# Patient Record
Sex: Female | Born: 1965
Health system: Southern US, Community
[De-identification: ages and names within clinical notes are randomized; demographics above are authoritative.]

## PROBLEM LIST (undated history)

## (undated) ENCOUNTER — Encounter: Attending: Hematology & Oncology | Primary: Hematology & Oncology

## (undated) ENCOUNTER — Encounter: Attending: Oncology | Primary: Oncology

## (undated) ENCOUNTER — Encounter

## (undated) ENCOUNTER — Ambulatory Visit

## (undated) ENCOUNTER — Ambulatory Visit: Attending: Radiation Oncology | Primary: Radiation Oncology

## (undated) ENCOUNTER — Telehealth

## (undated) ENCOUNTER — Ambulatory Visit: Payer: PRIVATE HEALTH INSURANCE | Attending: Hematology & Oncology | Primary: Hematology & Oncology

## (undated) ENCOUNTER — Telehealth: Attending: Hematology & Oncology | Primary: Hematology & Oncology

## (undated) ENCOUNTER — Non-Acute Institutional Stay: Payer: PRIVATE HEALTH INSURANCE

## (undated) ENCOUNTER — Encounter: Attending: Radiation Oncology | Primary: Radiation Oncology

## (undated) ENCOUNTER — Ambulatory Visit: Attending: Hematology & Oncology | Primary: Hematology & Oncology

## (undated) ENCOUNTER — Encounter: Payer: PRIVATE HEALTH INSURANCE | Attending: Hematology & Oncology | Primary: Hematology & Oncology

## (undated) ENCOUNTER — Telehealth: Attending: Oncology | Primary: Oncology

## (undated) ENCOUNTER — Encounter: Payer: PRIVATE HEALTH INSURANCE | Attending: Radiation Oncology | Primary: Radiation Oncology

## (undated) ENCOUNTER — Encounter: Payer: PRIVATE HEALTH INSURANCE | Attending: Adult Health | Primary: Adult Health

## (undated) ENCOUNTER — Telehealth
Attending: Student in an Organized Health Care Education/Training Program | Primary: Student in an Organized Health Care Education/Training Program

## (undated) ENCOUNTER — Ambulatory Visit: Payer: PRIVATE HEALTH INSURANCE

## (undated) ENCOUNTER — Telehealth: Attending: Surgery | Primary: Surgery

## (undated) ENCOUNTER — Telehealth: Attending: Radiation Oncology | Primary: Radiation Oncology

## (undated) ENCOUNTER — Encounter
Attending: Student in an Organized Health Care Education/Training Program | Primary: Student in an Organized Health Care Education/Training Program

## (undated) ENCOUNTER — Encounter: Payer: PRIVATE HEALTH INSURANCE | Attending: Surgery | Primary: Surgery

## (undated) ENCOUNTER — Encounter: Payer: PRIVATE HEALTH INSURANCE | Attending: Nurse Practitioner | Primary: Nurse Practitioner

## (undated) DIAGNOSIS — C211 Malignant neoplasm of anal canal: Secondary | ICD-10-CM

## (undated) DIAGNOSIS — F32A Depression, unspecified: Secondary | ICD-10-CM

## (undated) DIAGNOSIS — F329 Major depressive disorder, single episode, unspecified: Secondary | ICD-10-CM

## (undated) HISTORY — PX: DILATION AND CURETTAGE OF UTERUS: SHX78

## (undated) MED ORDER — ENCORAFENIB 75 MG CAPSULE: Freq: Every day | ORAL | 0 days

## (undated) MED ORDER — APIXABAN 5 MG TABLET: Freq: Two times a day (BID) | ORAL | 0.00000 days

## (undated) MED ORDER — BINIMETINIB 15 MG TABLET: Freq: Two times a day (BID) | ORAL | 0 days

## (undated) MED ORDER — CLOTRIMAZOLE-BETAMETHASONE 1 %-0.05 % LOTION: Freq: Two times a day (BID) | TOPICAL | 0.00000 days

## (undated) MED ORDER — AMLODIPINE 10 MG TABLET: Freq: Every day | ORAL | 0 days

---

## 2008-04-04 ENCOUNTER — Encounter: Admission: RE | Admit: 2008-04-04 | Discharge: 2008-04-04 | Payer: Self-pay | Admitting: Unknown Physician Specialty

## 2008-06-19 ENCOUNTER — Encounter: Admission: RE | Admit: 2008-06-19 | Discharge: 2008-06-19 | Payer: Self-pay | Admitting: Unknown Physician Specialty

## 2009-05-27 ENCOUNTER — Encounter: Admission: RE | Admit: 2009-05-27 | Discharge: 2009-05-27 | Payer: Self-pay | Admitting: Family Medicine

## 2010-01-11 ENCOUNTER — Encounter: Admission: RE | Admit: 2010-01-11 | Discharge: 2010-01-11 | Payer: Self-pay | Admitting: Unknown Physician Specialty

## 2010-05-05 ENCOUNTER — Encounter: Admission: RE | Admit: 2010-05-05 | Discharge: 2010-05-05 | Payer: Self-pay | Admitting: Unknown Physician Specialty

## 2013-05-06 ENCOUNTER — Ambulatory Visit (INDEPENDENT_AMBULATORY_CARE_PROVIDER_SITE_OTHER): Payer: Self-pay

## 2013-05-06 ENCOUNTER — Other Ambulatory Visit: Payer: Self-pay | Admitting: Adult Health

## 2013-05-06 DIAGNOSIS — R52 Pain, unspecified: Secondary | ICD-10-CM

## 2013-05-06 DIAGNOSIS — M25519 Pain in unspecified shoulder: Secondary | ICD-10-CM

## 2013-08-18 ENCOUNTER — Encounter: Payer: Self-pay | Admitting: Emergency Medicine

## 2013-08-18 ENCOUNTER — Emergency Department
Admission: EM | Admit: 2013-08-18 | Discharge: 2013-08-18 | Disposition: A | Discharge status: Home / Self Care | Payer: Federal, State, Local not specified - PPO | Source: Home / Self Care | Attending: Emergency Medicine | Admitting: Emergency Medicine

## 2013-08-18 DIAGNOSIS — F32A Depression, unspecified: Secondary | ICD-10-CM | POA: Insufficient documentation

## 2013-08-18 DIAGNOSIS — F329 Major depressive disorder, single episode, unspecified: Secondary | ICD-10-CM | POA: Insufficient documentation

## 2013-08-18 DIAGNOSIS — J029 Acute pharyngitis, unspecified: Secondary | ICD-10-CM

## 2013-08-18 DIAGNOSIS — H6691 Otitis media, unspecified, right ear: Secondary | ICD-10-CM

## 2013-08-18 DIAGNOSIS — H6690 Otitis media, unspecified, unspecified ear: Secondary | ICD-10-CM

## 2013-08-18 HISTORY — DX: Depression, unspecified: F32.A

## 2013-08-18 HISTORY — DX: Major depressive disorder, single episode, unspecified: F32.9

## 2013-08-18 LAB — POCT RAPID STREP A (OFFICE): RAPID STREP A SCREEN: NEGATIVE

## 2013-08-18 MED ORDER — AMOXICILLIN 875 MG PO TABS: 875.0000 mg | ORAL_TABLET | Freq: Two times a day (BID) | ORAL | Status: DC

## 2013-08-18 NOTE — ED Provider Notes (Signed)
CSN: [id number]     Arrival date & time 08/18/13  1433 History   First MD Initiated Contact with Patient 08/18/13 1503     Chief Complaint  Patient presents with  . Sore Throat    x 3 days  . Otalgia    x 3 days   (Consider location/radiation/quality/duration/timing/severity/associated sxs/prior Treatment) HPI Danielle Gonzales is a 48 y.o. female who complains of onset of cold symptoms for 5 days.  The symptoms are constant and mild-moderate in severity.  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 + sore throat No cough No pleuritic pain No wheezing No nasal congestion No post-nasal drainage No sinus pain/pressure No chest congestion No itchy/red eyes + R earache No hemoptysis No SOB No chills/sweats No fever No nausea No vomiting No abdominal pain No diarrhea No skin rashes No fatigue No myalgias No headache     Past Medical History  Diagnosis Date  . Depression    Past Surgical History  Procedure Laterality Date  . Dilation and curettage of uterus     History reviewed. No pertinent family history. History  Substance Use Topics  . Smoking status: Current Every Day Smoker -- 1.00 packs/day for 20 years    Types: Cigarettes  . Smokeless tobacco: Never Used  . Alcohol Use: No   OB History   Grav Para Term Preterm Abortions TAB SAB Ect Mult Living                 Review of Systems  All other systems reviewed and are negative.    Allergies  Review of patient's allergies indicates no known allergies.  Home Medications   Current Outpatient Rx  Name  Route  Sig  Dispense  Refill  . buPROPion (WELLBUTRIN XL) 300 MG 24 hr tablet   Oral   Take 300 mg by mouth daily.         . Multiple Vitamins-Minerals (MULTIVITAMIN WITH MINERALS) tablet   Oral   Take 1 tablet by mouth daily.         . propranolol (INDERAL) 60 MG tablet   Oral   Take 60 mg by mouth 3 (three) times daily.         Marland Kitchen amoxicillin (AMOXIL) 875 MG tablet  Oral   Take 1 tablet (875 mg total) by mouth 2 (two) times daily.   14 tablet   0    BP 131/90  Pulse 88  Temp(Src) 98.2 F (36.8 C) (Oral)  Ht 5\' 2"  (1.575 m)  Wt 172 lb (78.019 kg)  BMI 31.45 kg/m2  SpO2 97%  LMP 08/03/2013 Physical Exam  Nursing note and vitals reviewed. Constitutional: She is oriented to person, place, and time. She appears well-developed and well-nourished.  HENT:  Head: Normocephalic and atraumatic.  Right Ear: External ear and ear canal normal. A middle ear effusion is present.  Left Ear: Tympanic membrane, external ear and ear canal normal.  Nose: Mucosal edema present.  Mouth/Throat: Posterior oropharyngeal erythema (minimal, clear post nasal drip) present. No oropharyngeal exudate or posterior oropharyngeal edema.  Eyes: No scleral icterus.  Neck: Neck supple.  Cardiovascular: Regular rhythm and normal heart sounds.   Pulmonary/Chest: Effort normal and breath sounds normal. No respiratory distress. She has no decreased breath sounds. She has no wheezes. She has no rhonchi.  Neurological: She is alert and oriented to person, place, and time.  Skin: Skin is warm and dry.  Psychiatric: She has a normal mood  and affect. Her speech is normal.    ED Course  Procedures (including critical care time) Labs Review Labs Reviewed  POCT RAPID STREP A (OFFICE) - Normal   Imaging Review No results found.  Results for orders placed during the hospital encounter of 08/18/13  POCT RAPID STREP A (OFFICE)      Result Value Ref Range   Rapid Strep A Screen Negative  Negative   '   MDM   1. Sore throat   2. Right otitis media    1)  Take the prescribed antibiotic as instructed for Right acute otitis media.  Rapid strep negative.  No culture performed. 2)  Use nasal saline solution (over the counter) at least 3 times a day. 3)  Use over the counter decongestants like Zyrtec-D every 12 hours as needed to help with congestion.  If you have hypertension, do  not take medicines with sudafed.  4)  Can take tylenol every 6 hours or motrin every 8 hours for pain or fever. 5)  Follow up with your primary doctor if no improvement in 5-7 days, sooner if increasing pain, fever, or new symptoms.     Janeann Forehand, MD 08/18/13 [phone]

## 2013-08-18 NOTE — ED Notes (Signed)
Danielle Gonzales complains of sore throat and right ear pain for 3 days. Denies fever, chills, sweats, cough or shortness or breath.

## 2015-02-28 IMAGING — CR DG SHOULDER 2+V*L*
3 series · 3 of 3 positions shown · non-contrast
Comparison: None.

CLINICAL DATA: Lifting injury.  Left shoulder pain.

EXAM:
LEFT SHOULDER - 2+ VIEW

[view not recorded (1 of 3)]
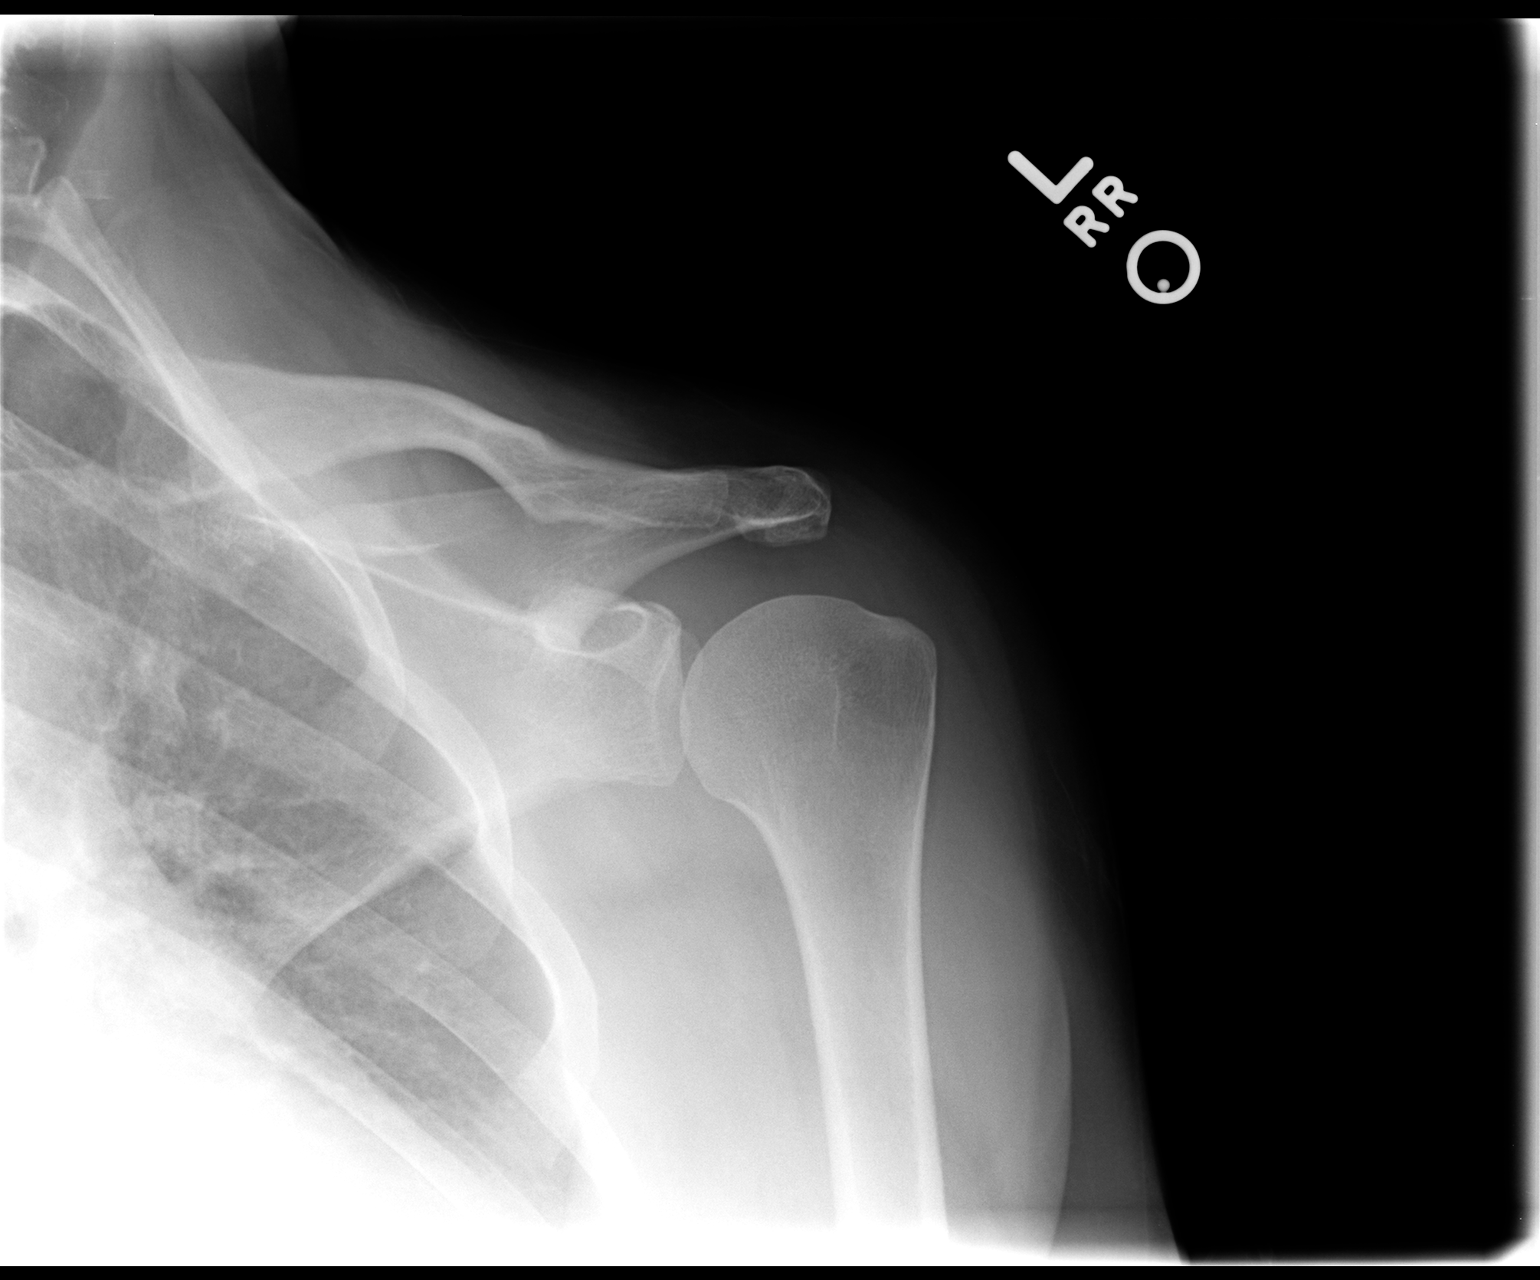

[view not recorded (2 of 3)]
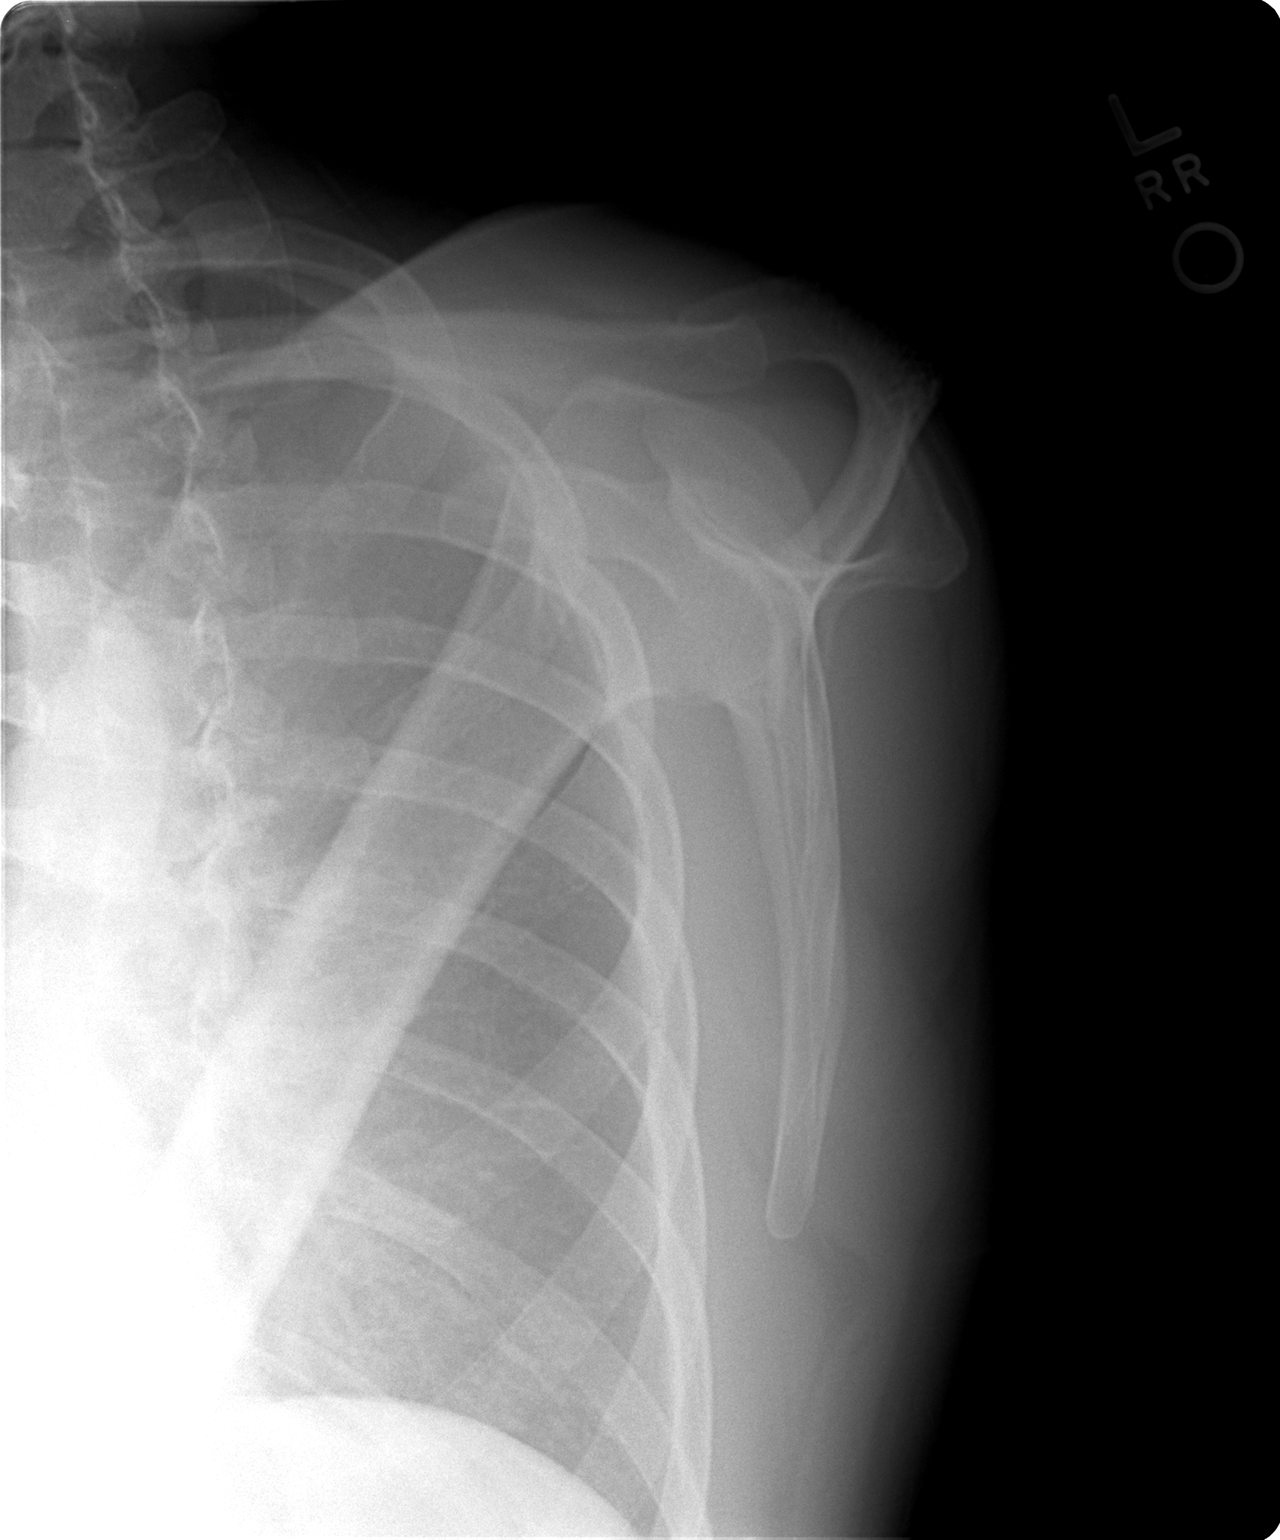

[view not recorded (3 of 3)]
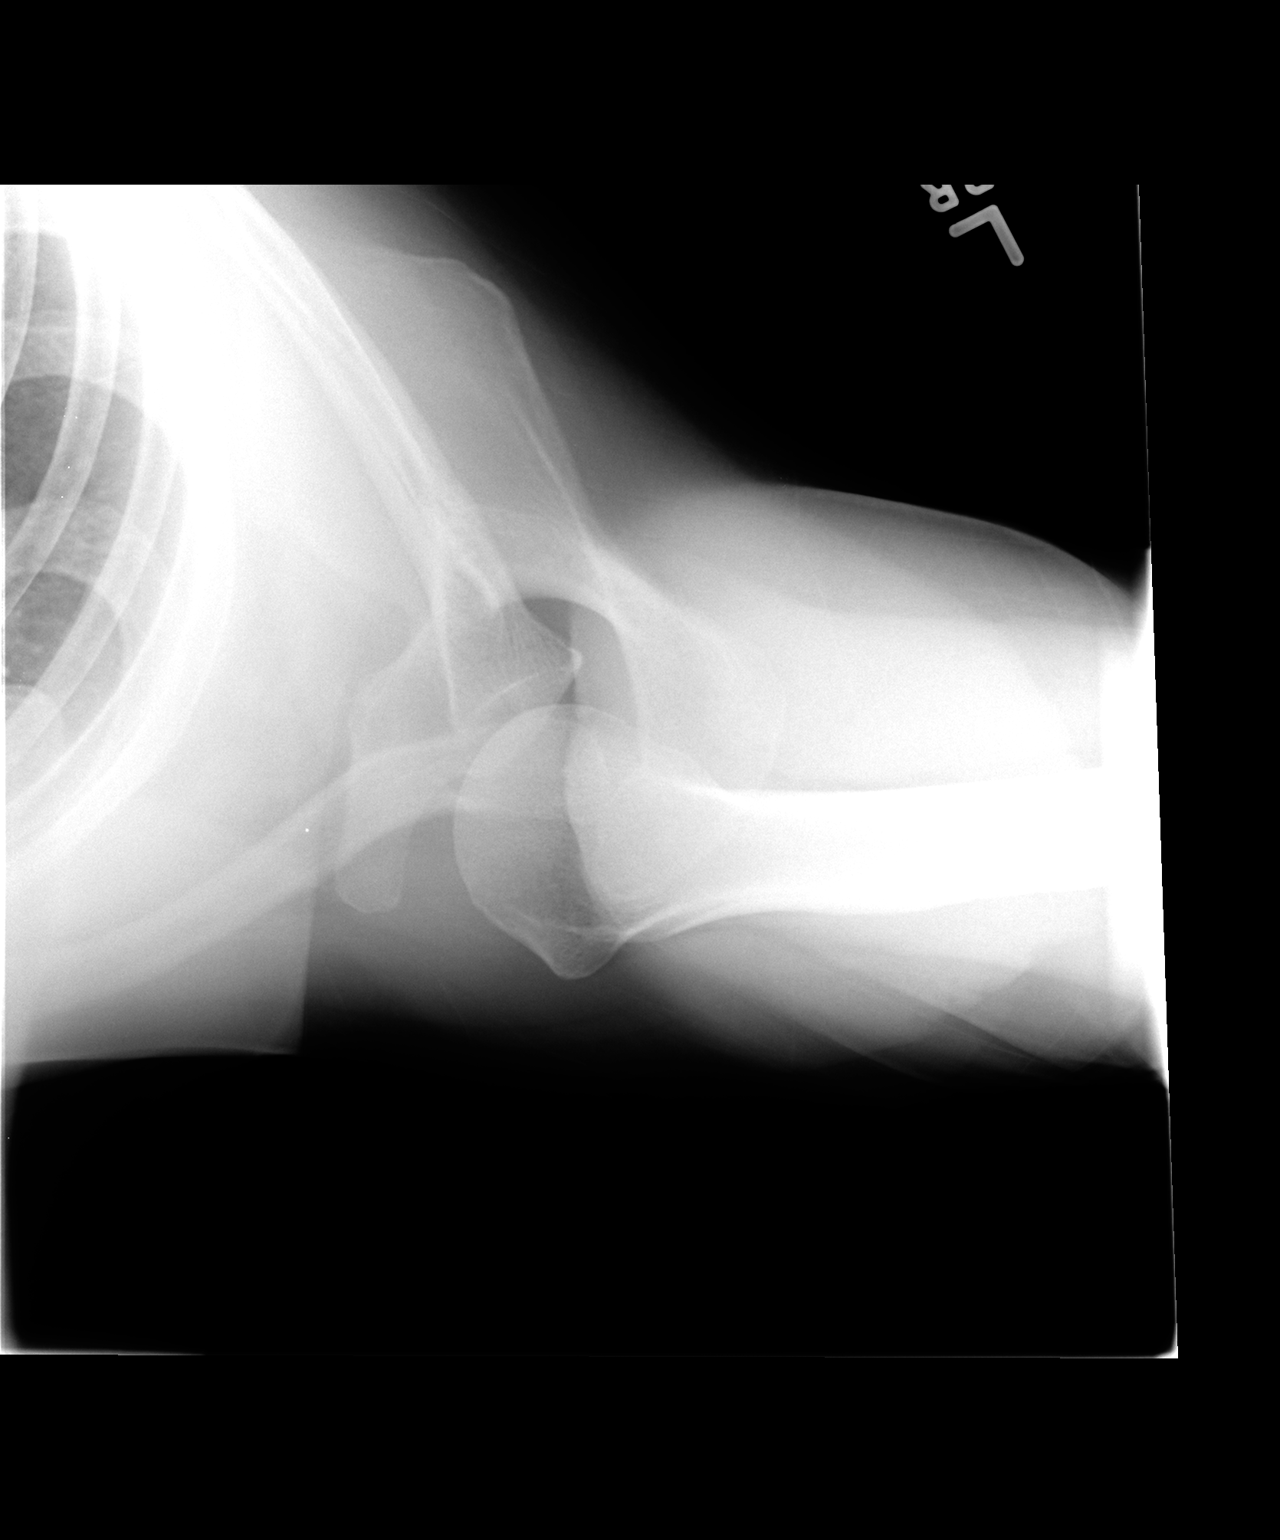

[3 of 3 positions shown; findings below may reference images not displayed]

FINDINGS: There is no evidence of fracture or dislocation. There is no
evidence of arthropathy or other focal bone abnormality. Soft
tissues are unremarkable.
IMPRESSION: Negative.

## 2015-09-10 ENCOUNTER — Encounter: Payer: Self-pay | Admitting: *Deleted

## 2015-09-10 ENCOUNTER — Emergency Department
Admission: EM | Admit: 2015-09-10 | Discharge: 2015-09-10 | Disposition: A | Discharge status: Home / Self Care | Payer: Federal, State, Local not specified - PPO | Source: Home / Self Care | Attending: Family Medicine | Admitting: Family Medicine

## 2015-09-10 DIAGNOSIS — R05 Cough: Secondary | ICD-10-CM

## 2015-09-10 DIAGNOSIS — L500 Allergic urticaria: Secondary | ICD-10-CM

## 2015-09-10 LAB — POCT CBC W AUTO DIFF (K'VILLE URGENT CARE)

## 2015-09-10 LAB — POCT RAPID STREP A (OFFICE): Rapid Strep A Screen: NEGATIVE

## 2015-09-10 MED ORDER — HYDROXYZINE HCL 25 MG PO TABS: ORAL_TABLET | ORAL | Status: DC

## 2015-09-10 MED ORDER — TRIAMCINOLONE ACETONIDE 40 MG/ML IJ SUSP
40.0000 mg | Freq: Once | INTRAMUSCULAR | Status: AC
  Administered 2015-09-10: 40 mg via INTRAMUSCULAR

## 2015-09-10 NOTE — Discharge Instructions (Signed)
Discontinue Benadryl and Zyrtec.  Continue Zantac.   Hives Hives are itchy, red, swollen areas of the skin. They can vary in size and location on your body. Hives can come and go for hours or several days (acute hives) or for several weeks (chronic hives). Hives do not spread from person to person (noncontagious). They may get worse with scratching, exercise, and emotional stress. CAUSES   Allergic reaction to food, additives, or drugs.  Infections, including the common cold.  Illness, such as vasculitis, lupus, or thyroid disease.  Exposure to sunlight, heat, or cold.  Exercise.  Stress.  Contact with chemicals. SYMPTOMS   Red or white swollen patches on the skin. The patches may change size, shape, and location quickly and repeatedly.  Itching.  Swelling of the hands, feet, and face. This may occur if hives develop deeper in the skin. DIAGNOSIS  Your caregiver can usually tell what is wrong by performing a physical exam. Skin or blood tests may also be done to determine the cause of your hives. In some cases, the cause cannot be determined. TREATMENT  Mild cases usually get better with medicines such as antihistamines. Severe cases may require an emergency epinephrine injection. If the cause of your hives is known, treatment includes avoiding that trigger.  HOME CARE INSTRUCTIONS   Avoid causes that trigger your hives.  Take antihistamines as directed by your caregiver to reduce the severity of your hives. Non-sedating or low-sedating antihistamines are usually recommended. Do not drive while taking an antihistamine.  Take any other medicines prescribed for itching as directed by your caregiver.  Wear loose-fitting clothing.  Keep all follow-up appointments as directed by your caregiver. SEEK MEDICAL CARE IF:   You have persistent or severe itching that is not relieved with medicine.  You have painful or swollen joints. SEEK IMMEDIATE MEDICAL CARE IF:   You have a  fever.  Your tongue or lips are swollen.  You have trouble breathing or swallowing.  You feel tightness in the throat or chest.  You have abdominal pain. These problems may be the first sign of a life-threatening allergic reaction. Call your local emergency services (911 in [place] MAKE SURE YOU:   Understand these instructions.  Will watch your condition.  Will get help right away if you are not doing well or get worse.   This information is not intended to replace advice given to you by your health care provider. Make sure you discuss any questions you have with your health care provider.   Document Released: 06/06/2005 Document Revised: 06/11/2013 Document Reviewed: 08/30/2011 Elsevier Interactive Patient Education [employer].

## 2015-09-10 NOTE — ED Notes (Signed)
Pt reports putting fertilizer on her lawn on 09/05/15. She developed an allergic reaction with hives and lip swelling on 09/07/15. She saw her PCP was given Zyrtec, Zantac, Benadryl, steroid injection and prednisone. She reports feeling better, until today her hives have returned. She also c/o runny nose, abd cramping, ears feel hot, lip swelling and joint pain. Reports labs drawn at PCP on 09/07/15 were normal.

## 2015-09-10 NOTE — ED Provider Notes (Signed)
CSN: [id number]     Arrival date & time 09/10/15  [id number] History   First MD Initiated Contact with Patient 09/10/15 1042     Chief Complaint  Patient presents with  . Urticaria      HPI Comments: Patient reports that she was spreading fertilizer 6 days ago.  The next day she became fatigued, followed the next day by development of hives and swelling around her lips and ears.  She visited her PCP where she received a steroid injection and started on Zyrtec, Zantac, Benadryl, and prednisone taper.  She felt better until today when her hives returned.  She also has developed nasal congestion, increased fatigue, myalgias, lower back ache, and vague lower abdominal discomfort ("like a menstrual cramp").  She notes that she has had occasional cough for about 5 days.  The history is provided by the patient.    Past Medical History  Diagnosis Date  . Depression    Past Surgical History  Procedure Laterality Date  . Dilation and curettage of uterus     History reviewed. No pertinent family history. Social History  Substance Use Topics  . Smoking status: Current Every Day Smoker -- 0.50 packs/day for 20 years    Types: Cigarettes  . Smokeless tobacco: Never Used  . Alcohol Use: No   OB History    No data available     Review of Systems No sore throat + cough No pleuritic pain No wheezing + nasal congestion + post-nasal drainage No sinus pain/pressure No itchy/red eyes No earache No hemoptysis No SOB No fever, but she feels hot  No nausea No vomiting + abdominal pain No diarrhea No urinary symptoms No skin rash + fatigue + myalgias + headache Used OTC meds without relief  Allergies  Review of patient's allergies indicates no known allergies.  Home Medications   Prior to Admission medications   Medication Sig Start Date End Date Taking? Authorizing Provider  cetirizine (ZYRTEC) 10 MG tablet Take 10 mg by mouth daily.   Yes Historical Provider, MD  diphenhydrAMINE  (BENADRYL) 25 MG tablet Take 25 mg by mouth every 6 (six) hours as needed.   Yes Historical Provider, MD  PREDNISONE PO Take by mouth.   Yes Historical Provider, MD  ranitidine (ZANTAC) 150 MG capsule Take 150 mg by mouth 2 (two) times daily.   Yes Historical Provider, MD  buPROPion (WELLBUTRIN XL) 300 MG 24 hr tablet Take 300 mg by mouth daily.    Historical Provider, MD  hydrOXYzine (ATARAX/VISTARIL) 25 MG tablet Take one or two tabs by mouth every 6 hours as needed for itching 09/10/15   Kandra Nicolas, MD  Multiple Vitamins-Minerals (MULTIVITAMIN WITH MINERALS) tablet Take 1 tablet by mouth daily.    Historical Provider, MD  propranolol (INDERAL) 60 MG tablet Take 60 mg by mouth 3 (three) times daily.    Historical Provider, MD   Meds Ordered and Administered this Visit   Medications  triamcinolone acetonide (KENALOG-40) injection 40 mg (not administered)    BP 148/91 mmHg  Pulse 96  Temp(Src) 98.1 F (36.7 C) (Oral)  Resp 16  Ht 5\' 3"  (1.6 m)  Wt 181 lb (82.101 kg)  BMI 32.07 kg/m2  SpO2 98% No data found.   Physical Exam Nursing notes and Vital Signs reviewed. Appearance:  Patient appears stated age, and in no acute distress Eyes:  Pupils are equal, round, and reactive to light and accomodation.  Extraocular movement is intact.  Conjunctivae are not inflamed  Ears:  Canals normal.  Tympanic membranes normal.  Nose:  Mildly congested turbinates.  No sinus tenderness.   Pharynx:  Normal Neck:  Supple.  Tender tonsillar nodes bilaterally.  Nontender enlarged posterior/lateral nodes are palpated bilaterally  Lungs:  Clear to auscultation.  Breath sounds are equal.  Moving air well. Heart:  Regular rate and rhythm without murmurs, rubs, or gallops.  Abdomen:  Nontender without masses or hepatosplenomegaly.  Bowel sounds are present.  No CVA or flank tenderness.  Extremities:  No edema.  Skin:  Minimal urticarial eruption   ED Course  Procedures none    Labs Reviewed  POCT  RAPID STREP A (OFFICE) negative  POCT CBC W AUTO DIFF ([facility]):  WBC 6.5; LY 34.3; MO 7.6; GR 58.1; Hgb 16.2; Platelets 282       MDM   1. Allergic urticaria ?etiology    Patient has early signs/symptoms of a developing viral URI Kenalog 40mg  IM Discontinue Benadryl and Zyrtec.  Continue Zantac. Begin Vistaril 25mg , one or two q6hr Followup with Family Doctor     Kandra Nicolas, MD 09/17/15 1346

## 2015-09-12 ENCOUNTER — Telehealth: Payer: Self-pay

## 2015-09-24 DIAGNOSIS — L501 Idiopathic urticaria: Secondary | ICD-10-CM | POA: Diagnosis not present

## 2015-09-24 DIAGNOSIS — T783XXA Angioneurotic edema, initial encounter: Secondary | ICD-10-CM | POA: Diagnosis not present

## 2015-10-21 DIAGNOSIS — K080 Exfoliation of teeth due to systemic causes: Secondary | ICD-10-CM | POA: Diagnosis not present

## 2015-11-06 DIAGNOSIS — G43909 Migraine, unspecified, not intractable, without status migrainosus: Secondary | ICD-10-CM | POA: Diagnosis not present

## 2015-11-06 DIAGNOSIS — J309 Allergic rhinitis, unspecified: Secondary | ICD-10-CM | POA: Diagnosis not present

## 2015-11-06 DIAGNOSIS — M5030 Other cervical disc degeneration, unspecified cervical region: Secondary | ICD-10-CM | POA: Diagnosis not present

## 2015-11-06 DIAGNOSIS — E7800 Pure hypercholesterolemia, unspecified: Secondary | ICD-10-CM | POA: Diagnosis not present

## 2016-02-11 DIAGNOSIS — S0502XA Injury of conjunctiva and corneal abrasion without foreign body, left eye, initial encounter: Secondary | ICD-10-CM | POA: Diagnosis not present

## 2016-02-11 DIAGNOSIS — H11432 Conjunctival hyperemia, left eye: Secondary | ICD-10-CM | POA: Diagnosis not present

## 2016-02-18 DIAGNOSIS — S0502XA Injury of conjunctiva and corneal abrasion without foreign body, left eye, initial encounter: Secondary | ICD-10-CM | POA: Diagnosis not present

## 2016-05-03 DIAGNOSIS — R739 Hyperglycemia, unspecified: Secondary | ICD-10-CM | POA: Diagnosis not present

## 2016-05-03 DIAGNOSIS — M9901 Segmental and somatic dysfunction of cervical region: Secondary | ICD-10-CM | POA: Diagnosis not present

## 2016-05-03 DIAGNOSIS — E7800 Pure hypercholesterolemia, unspecified: Secondary | ICD-10-CM | POA: Diagnosis not present

## 2016-05-03 DIAGNOSIS — G43909 Migraine, unspecified, not intractable, without status migrainosus: Secondary | ICD-10-CM | POA: Diagnosis not present

## 2016-05-03 DIAGNOSIS — M9902 Segmental and somatic dysfunction of thoracic region: Secondary | ICD-10-CM | POA: Diagnosis not present

## 2016-05-03 DIAGNOSIS — M50322 Other cervical disc degeneration at C5-C6 level: Secondary | ICD-10-CM | POA: Diagnosis not present

## 2016-05-03 DIAGNOSIS — Z0000 Encounter for general adult medical examination without abnormal findings: Secondary | ICD-10-CM | POA: Diagnosis not present

## 2016-05-03 DIAGNOSIS — G5603 Carpal tunnel syndrome, bilateral upper limbs: Secondary | ICD-10-CM | POA: Diagnosis not present

## 2016-05-03 DIAGNOSIS — M5412 Radiculopathy, cervical region: Secondary | ICD-10-CM | POA: Diagnosis not present

## 2016-05-03 DIAGNOSIS — R828 Abnormal findings on cytological and histological examination of urine: Secondary | ICD-10-CM | POA: Diagnosis not present

## 2016-05-03 DIAGNOSIS — Z1231 Encounter for screening mammogram for malignant neoplasm of breast: Secondary | ICD-10-CM | POA: Diagnosis not present

## 2016-06-03 DIAGNOSIS — Z1231 Encounter for screening mammogram for malignant neoplasm of breast: Secondary | ICD-10-CM | POA: Diagnosis not present

## 2016-06-23 DIAGNOSIS — R52 Pain, unspecified: Secondary | ICD-10-CM | POA: Diagnosis not present

## 2016-06-23 DIAGNOSIS — J069 Acute upper respiratory infection, unspecified: Secondary | ICD-10-CM | POA: Diagnosis not present

## 2016-06-23 DIAGNOSIS — R591 Generalized enlarged lymph nodes: Secondary | ICD-10-CM | POA: Diagnosis not present

## 2016-06-23 DIAGNOSIS — K645 Perianal venous thrombosis: Secondary | ICD-10-CM | POA: Diagnosis not present

## 2016-08-02 ENCOUNTER — Emergency Department
Admission: EM | Admit: 2016-08-02 | Discharge: 2016-08-02 | Disposition: A | Discharge status: Home / Self Care | Payer: Federal, State, Local not specified - PPO | Source: Home / Self Care | Attending: Family Medicine | Admitting: Family Medicine

## 2016-08-02 ENCOUNTER — Encounter: Payer: Self-pay | Admitting: *Deleted

## 2016-08-02 DIAGNOSIS — G510 Bell's palsy: Secondary | ICD-10-CM

## 2016-08-02 MED ORDER — VALACYCLOVIR HCL 1 G PO TABS: 1000.0000 mg | ORAL_TABLET | Freq: Three times a day (TID) | ORAL | 0 refills | Status: DC

## 2016-08-02 MED ORDER — PREDNISONE 20 MG PO TABS: ORAL_TABLET | ORAL | 0 refills | Status: DC

## 2016-08-02 NOTE — ED Provider Notes (Signed)
[facility]    CSN: [id number] Arrival date & time: 08/02/16  0858     History   Chief Complaint Chief Complaint  Patient presents with  . Facial Droop    HPI Danielle Gonzales is a 51 y.o. female.   While driving to work at [id number] today, patient noticed numbness in her right face/lips, and had difficulty drinking from her coffee cup because of weakness of her right lips.  She also noted tingling sensation in her right face (similar to numbness caused by dental local anesthetic).  She has noted that her right eyelid feels "lazy," but no changes in vision.  No headache.  No distal weakness or paresthesias.  She feels well otherwise.   The history is provided by the patient.    Past Medical History:  Diagnosis Date  . Depression     Patient Active Problem List   Diagnosis Date Noted  . Depression     Past Surgical History:  Procedure Laterality Date  . DILATION AND CURETTAGE OF UTERUS      OB History    No data available       Home Medications    Prior to Admission medications   Medication Sig Start Date End Date Taking? Authorizing Provider  Multiple Vitamins-Minerals (MULTIVITAMIN WITH MINERALS) tablet Take 1 tablet by mouth daily.    Historical Provider, MD  predniSONE (DELTASONE) 20 MG tablet Take one tab by mouth three times daily for 5 days, then one twice daily for 2 days, then one daily for 2 days.  Take with food. 08/02/16   Kandra Nicolas, MD  propranolol (INDERAL) 60 MG tablet Take 60 mg by mouth 3 (three) times daily.    Historical Provider, MD  valACYclovir (VALTREX) 1000 MG tablet Take 1 tablet (1,000 mg total) by mouth 3 (three) times daily. 08/02/16   Kandra Nicolas, MD    Family History History reviewed. No pertinent family history.  Social History Social History  Substance Use Topics  . Smoking status: Current Every Day Smoker    Packs/day: 0.50    Years: 20.00    Types: Cigarettes  . Smokeless tobacco: Never Used  . Alcohol use  No     Allergies   Patient has no known allergies.   Review of Systems Review of Systems  Constitutional: Negative for activity change, appetite change, chills, diaphoresis, fatigue, fever and unexpected weight change.  HENT: Negative for congestion, drooling, ear pain, facial swelling, hearing loss, sinus pain, sore throat, tinnitus and trouble swallowing.   Eyes: Negative for photophobia, pain, discharge, redness and visual disturbance.  Respiratory: Negative.   Cardiovascular: Negative.   Gastrointestinal: Negative.   Genitourinary: Negative.   Musculoskeletal: Negative.   Neurological: Positive for facial asymmetry and numbness. Negative for tremors, seizures, syncope, speech difficulty, weakness, light-headedness and headaches.     Physical Exam Triage Vital Signs ED Triage Vitals  Enc Vitals Group     BP 08/02/16 0916 155/86     Pulse Rate 08/02/16 0916 76     Resp 08/02/16 0916 16     Temp 08/02/16 0916 98.2 F (36.8 C)     Temp Source 08/02/16 0916 Oral     SpO2 08/02/16 0916 96 %     Weight 08/02/16 0917 187 lb (84.8 kg)     Height 08/02/16 0917 5\' 2"  (1.575 m)     Head Circumference --      Peak Flow --      Pain Score 08/02/16  0919 0     Pain Loc --      Pain Edu? --      Excl. in [place]? --    No data found.   Updated Vital Signs BP 155/86 (BP Location: Left Arm)   Pulse 76   Temp 98.2 F (36.8 C) (Oral)   Resp 16   Ht 5\' 2"  (1.575 m)   Wt 187 lb (84.8 kg)   LMP 08/01/2016   SpO2 96%   BMI 34.20 kg/m   Visual Acuity Right Eye Distance:   Left Eye Distance:   Bilateral Distance:    Right Eye Near:   Left Eye Near:    Bilateral Near:     Physical Exam  Constitutional: She is oriented to person, place, and time. She appears well-developed. No distress.  HENT:  Head: Atraumatic.    Right Ear: External ear normal.  Left Ear: External ear normal.  Nose: Nose normal.  Mouth/Throat: Oropharynx is clear and moist.  Decreased sensation over  right face as noted on diagram.   Bilateral cerumen impaction present; unable to fully visualize tympanic membranes.  Eyes: Conjunctivae and EOM are normal. Pupils are equal, round, and reactive to light. Right eye exhibits no discharge. Left eye exhibits no discharge.  Neck: Normal range of motion. Neck supple. No thyromegaly present.  Posterior/lateral nodes mildly tender to palpation   Cardiovascular: Normal rate, regular rhythm and normal heart sounds.   Carotids have normal upstrokes without bruits.  Pulmonary/Chest: Breath sounds normal.  Abdominal: There is no tenderness.  Musculoskeletal: She exhibits no edema or tenderness.  Neurological: She is alert and oriented to person, place, and time. She has normal reflexes. She displays no tremor. A cranial nerve deficit and sensory deficit is present. She displays a negative Romberg sign. Coordination and gait normal.  Slight weakness present right eyelids. Distinct weakness present right cheek/lips (unable to pucker lips). Tongue midline.    Skin: Skin is warm and dry. No rash noted.  Nursing note and vitals reviewed.    UC Treatments / Results  Labs (all labs ordered are listed, but only abnormal results are displayed) Labs Reviewed - No data to display  EKG  EKG Interpretation None       Radiology No results found.  Procedures Procedures (including critical care time)  Medications Ordered in UC Medications - No data to display   Initial Impression / Assessment and Plan / UC Course  I have reviewed the triage vital signs and the nursing notes.  Pertinent labs & imaging results that were available during my care of the patient were reviewed by me and considered in my medical decision making (see chart for details).    Begin Valtrex. Begin prednisone burst/taper (60mg /day for 5 days, then 40mg /2days, then 20mg /2days). If your right eyelid feels weak, apply an eye patch at night to keep it closed.  May also use  lubricating eye drops at night (Such as Refresh Lacri-lube eye ointment). If symptoms become significantly worse during the night or over the weekend, proceed to the local emergency room.  Will arrange follow-up appointment with neurologist in one week.    Final Clinical Impressions(s) / UC Diagnoses   Final diagnoses:  Bell's palsy    New Prescriptions New Prescriptions   PREDNISONE (DELTASONE) 20 MG TABLET    Take one tab by mouth three times daily for 5 days, then one twice daily for 2 days, then one daily for 2 days.  Take with food.  VALACYCLOVIR (VALTREX) 1000 MG TABLET    Take 1 tablet (1,000 mg total) by mouth 3 (three) times daily.     Kandra Nicolas, MD 08/02/16 1014

## 2016-08-02 NOTE — ED Triage Notes (Signed)
Pt c/o RT side facial numbness x 0630AM. She reports a similar episode in the past with allergic reaction to fertilizer.

## 2016-08-02 NOTE — Discharge Instructions (Signed)
If your right eyelid feels weak, apply an eye patch at night to keep it closed.  May also use lubricating eye drops at night (Such as Refresh Lacri-lube eye ointment). If symptoms become significantly worse during the night or over the weekend, proceed to the local emergency room.

## 2016-08-31 DIAGNOSIS — K644 Residual hemorrhoidal skin tags: Secondary | ICD-10-CM | POA: Diagnosis not present

## 2016-08-31 DIAGNOSIS — K5904 Chronic idiopathic constipation: Secondary | ICD-10-CM | POA: Diagnosis not present

## 2016-09-05 DIAGNOSIS — K629 Disease of anus and rectum, unspecified: Secondary | ICD-10-CM | POA: Diagnosis not present

## 2016-10-05 DIAGNOSIS — F17210 Nicotine dependence, cigarettes, uncomplicated: Secondary | ICD-10-CM | POA: Diagnosis not present

## 2016-10-05 DIAGNOSIS — C4351 Malignant melanoma of anal skin: Secondary | ICD-10-CM | POA: Diagnosis not present

## 2016-10-05 DIAGNOSIS — K644 Residual hemorrhoidal skin tags: Secondary | ICD-10-CM | POA: Diagnosis not present

## 2016-10-05 DIAGNOSIS — I10 Essential (primary) hypertension: Secondary | ICD-10-CM | POA: Diagnosis not present

## 2016-10-05 DIAGNOSIS — T07 Unspecified multiple injuries: Secondary | ICD-10-CM | POA: Diagnosis not present

## 2016-10-05 DIAGNOSIS — F329 Major depressive disorder, single episode, unspecified: Secondary | ICD-10-CM | POA: Diagnosis not present

## 2016-10-05 DIAGNOSIS — E785 Hyperlipidemia, unspecified: Secondary | ICD-10-CM | POA: Diagnosis not present

## 2016-10-05 DIAGNOSIS — K6289 Other specified diseases of anus and rectum: Secondary | ICD-10-CM | POA: Diagnosis not present

## 2016-10-05 DIAGNOSIS — Z79899 Other long term (current) drug therapy: Secondary | ICD-10-CM | POA: Diagnosis not present

## 2016-10-13 DIAGNOSIS — F17210 Nicotine dependence, cigarettes, uncomplicated: Secondary | ICD-10-CM | POA: Diagnosis not present

## 2016-10-13 DIAGNOSIS — Z8041 Family history of malignant neoplasm of ovary: Secondary | ICD-10-CM | POA: Diagnosis not present

## 2016-10-13 DIAGNOSIS — C4351 Malignant melanoma of anal skin: Secondary | ICD-10-CM | POA: Diagnosis not present

## 2016-10-13 DIAGNOSIS — Z803 Family history of malignant neoplasm of breast: Secondary | ICD-10-CM | POA: Diagnosis not present

## 2016-10-19 DIAGNOSIS — R948 Abnormal results of function studies of other organs and systems: Secondary | ICD-10-CM | POA: Diagnosis not present

## 2016-10-19 DIAGNOSIS — R935 Abnormal findings on diagnostic imaging of other abdominal regions, including retroperitoneum: Secondary | ICD-10-CM | POA: Diagnosis not present

## 2016-10-19 DIAGNOSIS — C4351 Malignant melanoma of anal skin: Secondary | ICD-10-CM | POA: Diagnosis not present

## 2016-10-20 DIAGNOSIS — C211 Malignant neoplasm of anal canal: Secondary | ICD-10-CM | POA: Diagnosis not present

## 2016-10-20 DIAGNOSIS — Z803 Family history of malignant neoplasm of breast: Secondary | ICD-10-CM | POA: Diagnosis not present

## 2016-10-20 DIAGNOSIS — Z8041 Family history of malignant neoplasm of ovary: Secondary | ICD-10-CM | POA: Diagnosis not present

## 2016-10-20 DIAGNOSIS — Z8371 Family history of colonic polyps: Secondary | ICD-10-CM | POA: Diagnosis not present

## 2016-10-20 DIAGNOSIS — F17210 Nicotine dependence, cigarettes, uncomplicated: Secondary | ICD-10-CM | POA: Diagnosis not present

## 2016-10-20 DIAGNOSIS — R948 Abnormal results of function studies of other organs and systems: Secondary | ICD-10-CM | POA: Diagnosis not present

## 2016-10-24 DIAGNOSIS — C211 Malignant neoplasm of anal canal: Secondary | ICD-10-CM | POA: Diagnosis not present

## 2016-10-24 DIAGNOSIS — R938 Abnormal findings on diagnostic imaging of other specified body structures: Secondary | ICD-10-CM | POA: Diagnosis not present

## 2016-10-26 DIAGNOSIS — C211 Malignant neoplasm of anal canal: Secondary | ICD-10-CM | POA: Diagnosis not present

## 2016-10-28 DIAGNOSIS — C4351 Malignant melanoma of anal skin: Secondary | ICD-10-CM | POA: Diagnosis not present

## 2016-10-31 DIAGNOSIS — C210 Malignant neoplasm of anus, unspecified: Secondary | ICD-10-CM | POA: Diagnosis not present

## 2016-11-02 DIAGNOSIS — R938 Abnormal findings on diagnostic imaging of other specified body structures: Secondary | ICD-10-CM | POA: Diagnosis not present

## 2016-11-02 DIAGNOSIS — N8500 Endometrial hyperplasia, unspecified: Secondary | ICD-10-CM | POA: Diagnosis not present

## 2016-11-07 DIAGNOSIS — C4351 Malignant melanoma of anal skin: Secondary | ICD-10-CM | POA: Diagnosis not present

## 2016-11-08 DIAGNOSIS — M5030 Other cervical disc degeneration, unspecified cervical region: Secondary | ICD-10-CM | POA: Diagnosis not present

## 2016-11-08 DIAGNOSIS — E7800 Pure hypercholesterolemia, unspecified: Secondary | ICD-10-CM | POA: Diagnosis not present

## 2016-11-08 DIAGNOSIS — C4351 Malignant melanoma of anal skin: Secondary | ICD-10-CM | POA: Diagnosis not present

## 2016-11-08 DIAGNOSIS — G43909 Migraine, unspecified, not intractable, without status migrainosus: Secondary | ICD-10-CM | POA: Diagnosis not present

## 2016-11-16 DIAGNOSIS — C4351 Malignant melanoma of anal skin: Secondary | ICD-10-CM | POA: Diagnosis not present

## 2016-11-17 DIAGNOSIS — E785 Hyperlipidemia, unspecified: Secondary | ICD-10-CM | POA: Diagnosis not present

## 2016-11-17 DIAGNOSIS — C4351 Malignant melanoma of anal skin: Secondary | ICD-10-CM | POA: Diagnosis not present

## 2016-11-17 DIAGNOSIS — Z79899 Other long term (current) drug therapy: Secondary | ICD-10-CM | POA: Diagnosis not present

## 2016-11-17 DIAGNOSIS — Z85820 Personal history of malignant melanoma of skin: Secondary | ICD-10-CM | POA: Diagnosis not present

## 2016-11-17 DIAGNOSIS — R591 Generalized enlarged lymph nodes: Secondary | ICD-10-CM | POA: Diagnosis not present

## 2016-11-17 DIAGNOSIS — F17210 Nicotine dependence, cigarettes, uncomplicated: Secondary | ICD-10-CM | POA: Diagnosis not present

## 2016-12-12 DIAGNOSIS — C4351 Malignant melanoma of anal skin: Secondary | ICD-10-CM | POA: Diagnosis not present

## 2016-12-23 DIAGNOSIS — C4351 Malignant melanoma of anal skin: Secondary | ICD-10-CM | POA: Diagnosis not present

## 2016-12-23 DIAGNOSIS — C439 Malignant melanoma of skin, unspecified: Secondary | ICD-10-CM | POA: Diagnosis not present

## 2017-01-23 DIAGNOSIS — C4351 Malignant melanoma of anal skin: Secondary | ICD-10-CM | POA: Diagnosis not present

## 2017-01-23 DIAGNOSIS — K625 Hemorrhage of anus and rectum: Secondary | ICD-10-CM | POA: Diagnosis not present

## 2017-03-09 DIAGNOSIS — C439 Malignant melanoma of skin, unspecified: Secondary | ICD-10-CM | POA: Diagnosis not present

## 2017-03-09 DIAGNOSIS — K625 Hemorrhage of anus and rectum: Secondary | ICD-10-CM | POA: Diagnosis not present

## 2017-03-10 DIAGNOSIS — C4351 Malignant melanoma of anal skin: Secondary | ICD-10-CM | POA: Diagnosis not present

## 2017-04-21 DIAGNOSIS — R918 Other nonspecific abnormal finding of lung field: Secondary | ICD-10-CM | POA: Diagnosis not present

## 2017-04-21 DIAGNOSIS — C439 Malignant melanoma of skin, unspecified: Secondary | ICD-10-CM | POA: Diagnosis not present

## 2017-04-21 DIAGNOSIS — C4351 Malignant melanoma of anal skin: Secondary | ICD-10-CM | POA: Diagnosis not present

## 2017-05-01 DIAGNOSIS — K6289 Other specified diseases of anus and rectum: Secondary | ICD-10-CM | POA: Diagnosis not present

## 2017-05-01 DIAGNOSIS — C4351 Malignant melanoma of anal skin: Secondary | ICD-10-CM | POA: Diagnosis not present

## 2017-05-05 DIAGNOSIS — M5030 Other cervical disc degeneration, unspecified cervical region: Secondary | ICD-10-CM | POA: Diagnosis not present

## 2017-05-05 DIAGNOSIS — Z0000 Encounter for general adult medical examination without abnormal findings: Secondary | ICD-10-CM | POA: Diagnosis not present

## 2017-05-05 DIAGNOSIS — Z1231 Encounter for screening mammogram for malignant neoplasm of breast: Secondary | ICD-10-CM | POA: Diagnosis not present

## 2017-05-05 DIAGNOSIS — G43909 Migraine, unspecified, not intractable, without status migrainosus: Secondary | ICD-10-CM | POA: Diagnosis not present

## 2017-05-05 DIAGNOSIS — Z01419 Encounter for gynecological examination (general) (routine) without abnormal findings: Secondary | ICD-10-CM | POA: Diagnosis not present

## 2017-05-05 DIAGNOSIS — Z720 Tobacco use: Secondary | ICD-10-CM | POA: Diagnosis not present

## 2017-05-05 DIAGNOSIS — E7800 Pure hypercholesterolemia, unspecified: Secondary | ICD-10-CM | POA: Diagnosis not present

## 2017-05-24 DIAGNOSIS — K080 Exfoliation of teeth due to systemic causes: Secondary | ICD-10-CM | POA: Diagnosis not present

## 2017-05-26 DIAGNOSIS — F17210 Nicotine dependence, cigarettes, uncomplicated: Secondary | ICD-10-CM | POA: Diagnosis not present

## 2017-05-26 DIAGNOSIS — Z5112 Encounter for antineoplastic immunotherapy: Secondary | ICD-10-CM | POA: Diagnosis not present

## 2017-05-26 DIAGNOSIS — C4351 Malignant melanoma of anal skin: Secondary | ICD-10-CM | POA: Diagnosis not present

## 2017-05-26 DIAGNOSIS — C799 Secondary malignant neoplasm of unspecified site: Secondary | ICD-10-CM | POA: Diagnosis not present

## 2017-06-16 DIAGNOSIS — Z5111 Encounter for antineoplastic chemotherapy: Secondary | ICD-10-CM | POA: Diagnosis not present

## 2017-06-16 DIAGNOSIS — Z006 Encounter for examination for normal comparison and control in clinical research program: Secondary | ICD-10-CM | POA: Diagnosis not present

## 2017-06-16 DIAGNOSIS — Z5112 Encounter for antineoplastic immunotherapy: Secondary | ICD-10-CM | POA: Diagnosis not present

## 2017-06-16 DIAGNOSIS — Z8041 Family history of malignant neoplasm of ovary: Secondary | ICD-10-CM | POA: Diagnosis not present

## 2017-06-16 DIAGNOSIS — Z803 Family history of malignant neoplasm of breast: Secondary | ICD-10-CM | POA: Diagnosis not present

## 2017-06-16 DIAGNOSIS — N9089 Other specified noninflammatory disorders of vulva and perineum: Secondary | ICD-10-CM | POA: Diagnosis not present

## 2017-06-16 DIAGNOSIS — F17210 Nicotine dependence, cigarettes, uncomplicated: Secondary | ICD-10-CM | POA: Diagnosis not present

## 2017-06-16 DIAGNOSIS — C799 Secondary malignant neoplasm of unspecified site: Secondary | ICD-10-CM | POA: Diagnosis not present

## 2017-06-16 DIAGNOSIS — C4351 Malignant melanoma of anal skin: Secondary | ICD-10-CM | POA: Diagnosis not present

## 2017-06-19 DIAGNOSIS — Z1231 Encounter for screening mammogram for malignant neoplasm of breast: Secondary | ICD-10-CM | POA: Diagnosis not present

## 2017-06-26 DIAGNOSIS — D225 Melanocytic nevi of trunk: Secondary | ICD-10-CM | POA: Diagnosis not present

## 2017-06-26 DIAGNOSIS — Z08 Encounter for follow-up examination after completed treatment for malignant neoplasm: Secondary | ICD-10-CM | POA: Diagnosis not present

## 2017-06-26 DIAGNOSIS — Z85828 Personal history of other malignant neoplasm of skin: Secondary | ICD-10-CM | POA: Diagnosis not present

## 2017-07-07 DIAGNOSIS — R7989 Other specified abnormal findings of blood chemistry: Secondary | ICD-10-CM | POA: Diagnosis not present

## 2017-07-07 DIAGNOSIS — C799 Secondary malignant neoplasm of unspecified site: Secondary | ICD-10-CM | POA: Diagnosis not present

## 2017-07-07 DIAGNOSIS — F17210 Nicotine dependence, cigarettes, uncomplicated: Secondary | ICD-10-CM | POA: Diagnosis not present

## 2017-07-07 DIAGNOSIS — Z8041 Family history of malignant neoplasm of ovary: Secondary | ICD-10-CM | POA: Diagnosis not present

## 2017-07-07 DIAGNOSIS — Z5111 Encounter for antineoplastic chemotherapy: Secondary | ICD-10-CM | POA: Diagnosis not present

## 2017-07-07 DIAGNOSIS — C439 Malignant melanoma of skin, unspecified: Secondary | ICD-10-CM | POA: Diagnosis not present

## 2017-07-07 DIAGNOSIS — Z803 Family history of malignant neoplasm of breast: Secondary | ICD-10-CM | POA: Diagnosis not present

## 2017-07-07 DIAGNOSIS — Z5112 Encounter for antineoplastic immunotherapy: Secondary | ICD-10-CM | POA: Diagnosis not present

## 2017-07-07 DIAGNOSIS — C4351 Malignant melanoma of anal skin: Secondary | ICD-10-CM | POA: Diagnosis not present

## 2017-07-27 DIAGNOSIS — C799 Secondary malignant neoplasm of unspecified site: Secondary | ICD-10-CM | POA: Diagnosis not present

## 2017-07-27 DIAGNOSIS — R918 Other nonspecific abnormal finding of lung field: Secondary | ICD-10-CM | POA: Diagnosis not present

## 2017-07-27 DIAGNOSIS — C4351 Malignant melanoma of anal skin: Secondary | ICD-10-CM | POA: Diagnosis not present

## 2017-07-28 DIAGNOSIS — F17210 Nicotine dependence, cigarettes, uncomplicated: Secondary | ICD-10-CM | POA: Diagnosis not present

## 2017-07-28 DIAGNOSIS — C799 Secondary malignant neoplasm of unspecified site: Secondary | ICD-10-CM | POA: Diagnosis not present

## 2017-07-28 DIAGNOSIS — Z5112 Encounter for antineoplastic immunotherapy: Secondary | ICD-10-CM | POA: Diagnosis not present

## 2017-07-28 DIAGNOSIS — C4351 Malignant melanoma of anal skin: Secondary | ICD-10-CM | POA: Diagnosis not present

## 2017-07-28 DIAGNOSIS — E0590 Thyrotoxicosis, unspecified without thyrotoxic crisis or storm: Secondary | ICD-10-CM | POA: Diagnosis not present

## 2017-08-18 DIAGNOSIS — M25549 Pain in joints of unspecified hand: Secondary | ICD-10-CM | POA: Diagnosis not present

## 2017-08-18 DIAGNOSIS — C799 Secondary malignant neoplasm of unspecified site: Secondary | ICD-10-CM | POA: Diagnosis not present

## 2017-08-18 DIAGNOSIS — M25559 Pain in unspecified hip: Secondary | ICD-10-CM | POA: Diagnosis not present

## 2017-08-18 DIAGNOSIS — E230 Hypopituitarism: Secondary | ICD-10-CM | POA: Diagnosis not present

## 2017-08-18 DIAGNOSIS — C4351 Malignant melanoma of anal skin: Secondary | ICD-10-CM | POA: Diagnosis not present

## 2017-08-25 DIAGNOSIS — E230 Hypopituitarism: Secondary | ICD-10-CM | POA: Diagnosis not present

## 2017-08-25 DIAGNOSIS — C799 Secondary malignant neoplasm of unspecified site: Secondary | ICD-10-CM | POA: Diagnosis not present

## 2017-08-25 DIAGNOSIS — C4351 Malignant melanoma of anal skin: Secondary | ICD-10-CM | POA: Diagnosis not present

## 2017-08-31 DIAGNOSIS — M50322 Other cervical disc degeneration at C5-C6 level: Secondary | ICD-10-CM | POA: Diagnosis not present

## 2017-08-31 DIAGNOSIS — M9901 Segmental and somatic dysfunction of cervical region: Secondary | ICD-10-CM | POA: Diagnosis not present

## 2017-08-31 DIAGNOSIS — M5412 Radiculopathy, cervical region: Secondary | ICD-10-CM | POA: Diagnosis not present

## 2017-08-31 DIAGNOSIS — M9902 Segmental and somatic dysfunction of thoracic region: Secondary | ICD-10-CM | POA: Diagnosis not present

## 2017-09-08 DIAGNOSIS — E236 Other disorders of pituitary gland: Secondary | ICD-10-CM | POA: Diagnosis not present

## 2017-09-08 DIAGNOSIS — C799 Secondary malignant neoplasm of unspecified site: Secondary | ICD-10-CM | POA: Diagnosis not present

## 2017-09-08 DIAGNOSIS — E0590 Thyrotoxicosis, unspecified without thyrotoxic crisis or storm: Secondary | ICD-10-CM | POA: Diagnosis not present

## 2017-09-08 DIAGNOSIS — D72829 Elevated white blood cell count, unspecified: Secondary | ICD-10-CM | POA: Diagnosis not present

## 2017-09-08 DIAGNOSIS — C4351 Malignant melanoma of anal skin: Secondary | ICD-10-CM | POA: Diagnosis not present

## 2017-09-08 DIAGNOSIS — F17210 Nicotine dependence, cigarettes, uncomplicated: Secondary | ICD-10-CM | POA: Diagnosis not present

## 2017-09-08 DIAGNOSIS — T451X5A Adverse effect of antineoplastic and immunosuppressive drugs, initial encounter: Secondary | ICD-10-CM | POA: Diagnosis not present

## 2017-09-08 DIAGNOSIS — E2749 Other adrenocortical insufficiency: Secondary | ICD-10-CM | POA: Diagnosis not present

## 2017-09-19 DIAGNOSIS — E7800 Pure hypercholesterolemia, unspecified: Secondary | ICD-10-CM | POA: Diagnosis not present

## 2017-09-19 DIAGNOSIS — R531 Weakness: Secondary | ICD-10-CM | POA: Diagnosis not present

## 2017-09-19 DIAGNOSIS — R197 Diarrhea, unspecified: Secondary | ICD-10-CM | POA: Diagnosis not present

## 2017-09-19 DIAGNOSIS — F329 Major depressive disorder, single episode, unspecified: Secondary | ICD-10-CM | POA: Diagnosis not present

## 2017-09-19 DIAGNOSIS — Z885 Allergy status to narcotic agent status: Secondary | ICD-10-CM | POA: Diagnosis not present

## 2017-09-19 DIAGNOSIS — I10 Essential (primary) hypertension: Secondary | ICD-10-CM | POA: Diagnosis not present

## 2017-09-19 DIAGNOSIS — Z79899 Other long term (current) drug therapy: Secondary | ICD-10-CM | POA: Diagnosis not present

## 2017-09-19 DIAGNOSIS — F17210 Nicotine dependence, cigarettes, uncomplicated: Secondary | ICD-10-CM | POA: Diagnosis not present

## 2017-09-20 DIAGNOSIS — Z87891 Personal history of nicotine dependence: Secondary | ICD-10-CM | POA: Diagnosis not present

## 2017-09-20 DIAGNOSIS — K829 Disease of gallbladder, unspecified: Secondary | ICD-10-CM | POA: Diagnosis not present

## 2017-09-20 DIAGNOSIS — E785 Hyperlipidemia, unspecified: Secondary | ICD-10-CM | POA: Diagnosis not present

## 2017-09-20 DIAGNOSIS — R1312 Dysphagia, oropharyngeal phase: Secondary | ICD-10-CM | POA: Diagnosis not present

## 2017-09-20 DIAGNOSIS — C4351 Malignant melanoma of anal skin: Secondary | ICD-10-CM | POA: Diagnosis not present

## 2017-09-20 DIAGNOSIS — Z79899 Other long term (current) drug therapy: Secondary | ICD-10-CM | POA: Diagnosis not present

## 2017-09-20 DIAGNOSIS — R1310 Dysphagia, unspecified: Secondary | ICD-10-CM | POA: Diagnosis not present

## 2017-09-20 DIAGNOSIS — E236 Other disorders of pituitary gland: Secondary | ICD-10-CM | POA: Diagnosis not present

## 2017-09-20 DIAGNOSIS — E860 Dehydration: Secondary | ICD-10-CM | POA: Diagnosis not present

## 2017-09-20 DIAGNOSIS — C439 Malignant melanoma of skin, unspecified: Secondary | ICD-10-CM | POA: Diagnosis not present

## 2017-09-20 DIAGNOSIS — C799 Secondary malignant neoplasm of unspecified site: Secondary | ICD-10-CM | POA: Diagnosis not present

## 2017-09-20 DIAGNOSIS — T451X5A Adverse effect of antineoplastic and immunosuppressive drugs, initial encounter: Secondary | ICD-10-CM | POA: Diagnosis not present

## 2017-09-20 DIAGNOSIS — R630 Anorexia: Secondary | ICD-10-CM | POA: Diagnosis not present

## 2017-09-20 DIAGNOSIS — E2749 Other adrenocortical insufficiency: Secondary | ICD-10-CM | POA: Diagnosis not present

## 2017-09-20 DIAGNOSIS — E876 Hypokalemia: Secondary | ICD-10-CM | POA: Diagnosis not present

## 2017-09-20 DIAGNOSIS — K521 Toxic gastroenteritis and colitis: Secondary | ICD-10-CM | POA: Diagnosis not present

## 2017-09-20 DIAGNOSIS — K529 Noninfective gastroenteritis and colitis, unspecified: Secondary | ICD-10-CM | POA: Diagnosis not present

## 2017-09-20 DIAGNOSIS — E0590 Thyrotoxicosis, unspecified without thyrotoxic crisis or storm: Secondary | ICD-10-CM | POA: Diagnosis not present

## 2017-09-20 DIAGNOSIS — I10 Essential (primary) hypertension: Secondary | ICD-10-CM | POA: Diagnosis not present

## 2017-09-21 DIAGNOSIS — E876 Hypokalemia: Secondary | ICD-10-CM | POA: Diagnosis not present

## 2017-09-21 DIAGNOSIS — K529 Noninfective gastroenteritis and colitis, unspecified: Secondary | ICD-10-CM | POA: Diagnosis not present

## 2017-09-21 DIAGNOSIS — R112 Nausea with vomiting, unspecified: Secondary | ICD-10-CM | POA: Diagnosis not present

## 2017-09-21 DIAGNOSIS — R1013 Epigastric pain: Secondary | ICD-10-CM | POA: Diagnosis not present

## 2017-09-21 DIAGNOSIS — R197 Diarrhea, unspecified: Secondary | ICD-10-CM | POA: Diagnosis not present

## 2017-09-21 DIAGNOSIS — K6289 Other specified diseases of anus and rectum: Secondary | ICD-10-CM | POA: Diagnosis not present

## 2017-09-22 DIAGNOSIS — R1013 Epigastric pain: Secondary | ICD-10-CM | POA: Diagnosis not present

## 2017-09-22 DIAGNOSIS — C799 Secondary malignant neoplasm of unspecified site: Secondary | ICD-10-CM | POA: Diagnosis not present

## 2017-09-22 DIAGNOSIS — K521 Toxic gastroenteritis and colitis: Secondary | ICD-10-CM | POA: Diagnosis not present

## 2017-09-22 DIAGNOSIS — K529 Noninfective gastroenteritis and colitis, unspecified: Secondary | ICD-10-CM | POA: Diagnosis not present

## 2017-09-23 DIAGNOSIS — R1013 Epigastric pain: Secondary | ICD-10-CM | POA: Diagnosis not present

## 2017-09-23 DIAGNOSIS — K529 Noninfective gastroenteritis and colitis, unspecified: Secondary | ICD-10-CM | POA: Diagnosis not present

## 2017-09-23 DIAGNOSIS — K521 Toxic gastroenteritis and colitis: Secondary | ICD-10-CM | POA: Diagnosis not present

## 2017-09-23 DIAGNOSIS — C799 Secondary malignant neoplasm of unspecified site: Secondary | ICD-10-CM | POA: Diagnosis not present

## 2017-09-25 DIAGNOSIS — E063 Autoimmune thyroiditis: Secondary | ICD-10-CM | POA: Diagnosis not present

## 2017-10-06 DIAGNOSIS — E2749 Other adrenocortical insufficiency: Secondary | ICD-10-CM | POA: Diagnosis not present

## 2017-10-06 DIAGNOSIS — Z7952 Long term (current) use of systemic steroids: Secondary | ICD-10-CM | POA: Diagnosis not present

## 2017-10-06 DIAGNOSIS — R7989 Other specified abnormal findings of blood chemistry: Secondary | ICD-10-CM | POA: Diagnosis not present

## 2017-10-06 DIAGNOSIS — E0590 Thyrotoxicosis, unspecified without thyrotoxic crisis or storm: Secondary | ICD-10-CM | POA: Diagnosis not present

## 2017-10-06 DIAGNOSIS — E039 Hypothyroidism, unspecified: Secondary | ICD-10-CM | POA: Diagnosis not present

## 2017-10-06 DIAGNOSIS — C4351 Malignant melanoma of anal skin: Secondary | ICD-10-CM | POA: Diagnosis not present

## 2017-10-06 DIAGNOSIS — Z9289 Personal history of other medical treatment: Secondary | ICD-10-CM | POA: Diagnosis not present

## 2017-10-06 DIAGNOSIS — F17210 Nicotine dependence, cigarettes, uncomplicated: Secondary | ICD-10-CM | POA: Diagnosis not present

## 2017-10-06 DIAGNOSIS — C799 Secondary malignant neoplasm of unspecified site: Secondary | ICD-10-CM | POA: Diagnosis not present

## 2017-10-06 DIAGNOSIS — K529 Noninfective gastroenteritis and colitis, unspecified: Secondary | ICD-10-CM | POA: Diagnosis not present

## 2017-10-06 DIAGNOSIS — Z8349 Family history of other endocrine, nutritional and metabolic diseases: Secondary | ICD-10-CM | POA: Diagnosis not present

## 2017-11-03 DIAGNOSIS — Z720 Tobacco use: Secondary | ICD-10-CM | POA: Diagnosis not present

## 2017-11-03 DIAGNOSIS — R918 Other nonspecific abnormal finding of lung field: Secondary | ICD-10-CM | POA: Diagnosis not present

## 2017-11-03 DIAGNOSIS — K521 Toxic gastroenteritis and colitis: Secondary | ICD-10-CM | POA: Diagnosis not present

## 2017-11-03 DIAGNOSIS — Z7952 Long term (current) use of systemic steroids: Secondary | ICD-10-CM | POA: Diagnosis not present

## 2017-11-03 DIAGNOSIS — R51 Headache: Secondary | ICD-10-CM | POA: Diagnosis not present

## 2017-11-03 DIAGNOSIS — C4351 Malignant melanoma of anal skin: Secondary | ICD-10-CM | POA: Diagnosis not present

## 2017-11-03 DIAGNOSIS — E0580 Other thyrotoxicosis without thyrotoxic crisis or storm: Secondary | ICD-10-CM | POA: Diagnosis not present

## 2017-11-03 DIAGNOSIS — C799 Secondary malignant neoplasm of unspecified site: Secondary | ICD-10-CM | POA: Diagnosis not present

## 2017-11-03 DIAGNOSIS — E230 Hypopituitarism: Secondary | ICD-10-CM | POA: Diagnosis not present

## 2017-11-03 DIAGNOSIS — E2749 Other adrenocortical insufficiency: Secondary | ICD-10-CM | POA: Diagnosis not present

## 2017-11-03 DIAGNOSIS — Z9225 Personal history of immunosupression therapy: Secondary | ICD-10-CM | POA: Diagnosis not present

## 2017-11-06 ENCOUNTER — Other Ambulatory Visit: Payer: Self-pay | Admitting: Unknown Physician Specialty

## 2017-11-06 ENCOUNTER — Ambulatory Visit (INDEPENDENT_AMBULATORY_CARE_PROVIDER_SITE_OTHER): Payer: Federal, State, Local not specified - PPO

## 2017-11-06 DIAGNOSIS — C4351 Malignant melanoma of anal skin: Secondary | ICD-10-CM | POA: Diagnosis not present

## 2017-11-06 DIAGNOSIS — R059 Cough, unspecified: Secondary | ICD-10-CM

## 2017-11-06 DIAGNOSIS — J069 Acute upper respiratory infection, unspecified: Secondary | ICD-10-CM | POA: Diagnosis not present

## 2017-11-06 DIAGNOSIS — R05 Cough: Secondary | ICD-10-CM

## 2017-11-06 DIAGNOSIS — R509 Fever, unspecified: Secondary | ICD-10-CM | POA: Diagnosis not present

## 2017-11-06 DIAGNOSIS — K529 Noninfective gastroenteritis and colitis, unspecified: Secondary | ICD-10-CM | POA: Diagnosis not present

## 2017-11-06 DIAGNOSIS — R918 Other nonspecific abnormal finding of lung field: Secondary | ICD-10-CM | POA: Diagnosis not present

## 2017-11-06 DIAGNOSIS — R7309 Other abnormal glucose: Secondary | ICD-10-CM | POA: Diagnosis not present

## 2017-11-10 DIAGNOSIS — F17210 Nicotine dependence, cigarettes, uncomplicated: Secondary | ICD-10-CM | POA: Diagnosis not present

## 2017-11-10 DIAGNOSIS — N83201 Unspecified ovarian cyst, right side: Secondary | ICD-10-CM | POA: Diagnosis not present

## 2017-11-10 DIAGNOSIS — M545 Low back pain: Secondary | ICD-10-CM | POA: Diagnosis not present

## 2017-11-10 DIAGNOSIS — C4359 Malignant melanoma of other part of trunk: Secondary | ICD-10-CM | POA: Diagnosis not present

## 2017-11-10 DIAGNOSIS — E7800 Pure hypercholesterolemia, unspecified: Secondary | ICD-10-CM | POA: Diagnosis not present

## 2017-11-10 DIAGNOSIS — Z79899 Other long term (current) drug therapy: Secondary | ICD-10-CM | POA: Diagnosis not present

## 2017-11-10 DIAGNOSIS — M5430 Sciatica, unspecified side: Secondary | ICD-10-CM | POA: Diagnosis not present

## 2017-11-10 DIAGNOSIS — M79661 Pain in right lower leg: Secondary | ICD-10-CM | POA: Diagnosis not present

## 2017-11-10 DIAGNOSIS — I10 Essential (primary) hypertension: Secondary | ICD-10-CM | POA: Diagnosis not present

## 2017-11-10 DIAGNOSIS — M5441 Lumbago with sciatica, right side: Secondary | ICD-10-CM | POA: Diagnosis not present

## 2017-11-10 DIAGNOSIS — D259 Leiomyoma of uterus, unspecified: Secondary | ICD-10-CM | POA: Diagnosis not present

## 2017-11-10 DIAGNOSIS — R300 Dysuria: Secondary | ICD-10-CM | POA: Diagnosis not present

## 2017-11-10 DIAGNOSIS — F329 Major depressive disorder, single episode, unspecified: Secondary | ICD-10-CM | POA: Diagnosis not present

## 2017-11-10 DIAGNOSIS — Z885 Allergy status to narcotic agent status: Secondary | ICD-10-CM | POA: Diagnosis not present

## 2017-11-10 DIAGNOSIS — K5730 Diverticulosis of large intestine without perforation or abscess without bleeding: Secondary | ICD-10-CM | POA: Diagnosis not present

## 2017-11-10 DIAGNOSIS — R339 Retention of urine, unspecified: Secondary | ICD-10-CM | POA: Diagnosis not present

## 2017-11-13 ENCOUNTER — Encounter: Payer: Self-pay | Admitting: *Deleted

## 2017-11-13 ENCOUNTER — Other Ambulatory Visit: Payer: Self-pay

## 2017-11-13 ENCOUNTER — Emergency Department
Admission: EM | Admit: 2017-11-13 | Discharge: 2017-11-13 | Disposition: A | Discharge status: Home / Self Care | Payer: Federal, State, Local not specified - PPO | Source: Home / Self Care | Attending: Family Medicine | Admitting: Family Medicine

## 2017-11-13 DIAGNOSIS — R109 Unspecified abdominal pain: Secondary | ICD-10-CM

## 2017-11-13 DIAGNOSIS — R208 Other disturbances of skin sensation: Secondary | ICD-10-CM

## 2017-11-13 DIAGNOSIS — M5441 Lumbago with sciatica, right side: Secondary | ICD-10-CM

## 2017-11-13 HISTORY — DX: Malignant neoplasm of anal canal: C21.1

## 2017-11-13 MED ORDER — TRAMADOL HCL 50 MG PO TABS: 50.0000 mg | ORAL_TABLET | Freq: Four times a day (QID) | ORAL | 0 refills | Status: AC | PRN

## 2017-11-13 MED ORDER — METHOCARBAMOL 500 MG PO TABS: 500.0000 mg | ORAL_TABLET | Freq: Two times a day (BID) | ORAL | 0 refills | Status: AC

## 2017-11-13 MED ORDER — VALACYCLOVIR HCL 1 G PO TABS: 1000.0000 mg | ORAL_TABLET | Freq: Three times a day (TID) | ORAL | 0 refills | Status: AC

## 2017-11-13 NOTE — ED Triage Notes (Signed)
Pt c/o RT side back pain that radiates down her RT leg x 4 days. She went to the ED Thursday via EMS with Dx of Sciatica; was given Valium and IBF 800 mg without relief.

## 2017-11-13 NOTE — Discharge Instructions (Signed)
°  Robaxin (methocarbamol) is a muscle relaxer and may cause drowsiness. Do not drink alcohol, drive, or operate heavy machinery while taking.  Tramadol is strong pain medication. While taking, do not drink alcohol, drive, or perform any other activities that requires focus while taking these medications.   Your symptoms of burning pain on your skin with just light touch could be due to early signs of Shingles.  A rash may appear within the next few days.  You may start taking the Valtrex (antiviral medication) now or wait to see if a rash develops.  If a rash develops it is best to start taking the medication right away.  Please follow up with your family doctor later this week if not improving.

## 2017-11-13 NOTE — ED Provider Notes (Signed)
[facility]    CSN: [id number] Arrival date & time: 11/13/17  1043     History   Chief Complaint Chief Complaint  Patient presents with  . Back Pain    HPI Jacqeline Broers is a 52 y.o. female.   HPI  Tamaya Pun is a 52 y.o. female presenting to UC with c/o 4 days of Right flank and Right lower back pain that radiates down her Right leg.  She was taken to the ED via EMS on Thursday for same.  She had a CT at that time, which r/o appendicitis, renal stones and gallstones.  She was dx with sciatica, tx with diazepam and 800mg  ibuprofen but she notes the pain is still moderate to severe.  Pt states pain is even worse with light touch on the skin of her lower back.  She has not noticed any rashes. Denies fever, chills, n/v/d. No known injury.    Past Medical History:  Diagnosis Date  . Depression   . Melanoma of anal canal [facility])     Patient Active Problem List   Diagnosis Date Noted  . Depression     Past Surgical History:  Procedure Laterality Date  . DILATION AND CURETTAGE OF UTERUS      OB History   None      Home Medications    Prior to Admission medications   Medication Sig Start Date End Date Taking? Authorizing Provider  propranolol (INDERAL) 60 MG tablet Take 60 mg by mouth 3 (three) times daily.   Yes [provider]  simvastatin (ZOCOR) 10 MG tablet Take by mouth. 08/16/16  Yes [provider]  ibuprofen (ADVIL,MOTRIN) 800 MG tablet  11/10/17   [provider]  MEKINIST 0.5 MG tablet  11/09/17   [provider]  methocarbamol (ROBAXIN) 500 MG tablet Take 1 tablet (500 mg total) by mouth 2 (two) times daily. 11/13/17   Noe Gens, PA-C  Multiple Vitamins-Minerals (MULTIVITAMIN WITH MINERALS) tablet Take 1 tablet by mouth daily.    [provider]  predniSONE (DELTASONE) 20 MG tablet Take by mouth.    [provider]  rosuvastatin (CRESTOR) 10 MG tablet Take by mouth.    [provider]    TAFINLAR 50 MG capsule  11/09/17   [provider]  traMADol (ULTRAM) 50 MG tablet Take 1 tablet (50 mg total) by mouth every 6 (six) hours as needed. 11/13/17   Noe Gens, PA-C  valACYclovir (VALTREX) 1000 MG tablet Take 1 tablet (1,000 mg total) by mouth 3 (three) times daily. 11/13/17   Noe Gens, PA-C    Family History History reviewed. No pertinent family history.  Social History Social History   Tobacco Use  . Smoking status: Current Every Day Smoker    Packs/day: 0.50    Years: 20.00    Pack years: 10.00    Types: Cigarettes  . Smokeless tobacco: Never Used  Substance Use Topics  . Alcohol use: No  . Drug use: No     Allergies   Hydromorphone; Morphine; and Oxycodone   Review of Systems Review of Systems  Constitutional: Negative for chills and fever.  Gastrointestinal: Negative for abdominal pain, diarrhea, nausea and vomiting.  Genitourinary: Positive for flank pain (Right). Negative for dysuria, frequency, hematuria, pelvic pain and urgency.  Musculoskeletal: Positive for back pain and myalgias.  Skin: Negative for rash and wound.     Physical Exam Triage Vital Signs ED Triage Vitals  Enc Vitals Group  BP 11/13/17 1120 (!) 162/93     Pulse Rate 11/13/17 1120 76     Resp 11/13/17 1120 18     Temp 11/13/17 1120 97.6 F (36.4 C)     Temp Source 11/13/17 1120 Oral     SpO2 11/13/17 1120 99 %     Weight 11/13/17 1121 182 lb (82.6 kg)     Height 11/13/17 1121 5\' 2"  (1.575 m)     Head Circumference --      Peak Flow --      Pain Score 11/13/17 1121 9     Pain Loc --      Pain Edu? --      Excl. in [place]? --    No data found.  Updated Vital Signs BP (!) 165/97 (BP Location: Left Arm)   Pulse 76   Temp 97.6 F (36.4 C) (Oral)   Resp 18   Ht 5\' 2"  (1.575 m)   Wt 182 lb (82.6 kg)   LMP 09/20/2017   SpO2 99%   BMI 33.29 kg/m   Visual Acuity Right Eye Distance:   Left Eye Distance:   Bilateral Distance:    Right Eye Near:    Left Eye Near:    Bilateral Near:     Physical Exam  Constitutional: She is oriented to person, place, and time. She appears well-developed and well-nourished. No distress.  HENT:  Head: Normocephalic and atraumatic.  Eyes: EOM are normal.  Neck: Normal range of motion.  Cardiovascular: Normal rate.  Pulmonary/Chest: Effort normal.  Musculoskeletal: Normal range of motion. She exhibits tenderness. She exhibits no edema.  No midline spinal tenderness. Tenderness to Right mid thoracic muscles and lumbar muscles. No bony tenderness of Right hip. Negative straight leg raise  Neurological: She is alert and oriented to person, place, and time.  Skin: Skin is warm and dry. No rash noted. She is not diaphoretic. No erythema.  Right back: tenderness with light touch over Right flank. No rashes noted.  Psychiatric: She has a normal mood and affect. Her behavior is normal.  Nursing note and vitals reviewed.    UC Treatments / Results  Labs (all labs ordered are listed, but only abnormal results are displayed) Labs Reviewed - No data to display  EKG None  Radiology No results found.  Procedures Procedures (including critical care time)  Medications Ordered in UC Medications - No data to display  Initial Impression / Assessment and Plan / UC Course  I have reviewed the triage vital signs and the nursing notes.  Pertinent labs & imaging results that were available during my care of the patient were reviewed by me and considered in my medical decision making (see chart for details).     No red flag symptoms. Question early shingles given pain with light touch over her skin. Will try robaxcin instead of valium for  Home care instructions provided below.    Final Clinical Impressions(s) / UC Diagnoses   Final diagnoses:  Acute right-sided low back pain with right-sided sciatica  Right flank pain  Skin pain     Discharge Instructions      Robaxin (methocarbamol) is a  muscle relaxer and may cause drowsiness. Do not drink alcohol, drive, or operate heavy machinery while taking.  Tramadol is strong pain medication. While taking, do not drink alcohol, drive, or perform any other activities that requires focus while taking these medications.   Your symptoms of burning pain on your skin with just light touch could  be due to early signs of Shingles.  A rash may appear within the next few days.  You may start taking the Valtrex (antiviral medication) now or wait to see if a rash develops.  If a rash develops it is best to start taking the medication right away.  Please follow up with your family doctor later this week if not improving.     ED Prescriptions    Medication Sig Dispense Auth. Provider   valACYclovir (VALTREX) 1000 MG tablet Take 1 tablet (1,000 mg total) by mouth 3 (three) times daily. 21 tablet Gerarda Fraction, Camela Wich O, PA-C   traMADol (ULTRAM) 50 MG tablet Take 1 tablet (50 mg total) by mouth every 6 (six) hours as needed. 15 tablet Gerarda Fraction, Laiken Sandy O, PA-C   methocarbamol (ROBAXIN) 500 MG tablet Take 1 tablet (500 mg total) by mouth 2 (two) times daily. 20 tablet Noe Gens, PA-C     Controlled Substance Prescriptions [place] Controlled Substance Registry consulted? Yes, I have consulted the [place] Controlled Substances Registry for this patient, and feel the risk/benefit ratio today is favorable for proceeding with this prescription for a controlled substance.   Noe Gens, [place] 11/13/17 1240

## 2017-11-14 DIAGNOSIS — M50322 Other cervical disc degeneration at C5-C6 level: Secondary | ICD-10-CM | POA: Diagnosis not present

## 2017-11-14 DIAGNOSIS — M5412 Radiculopathy, cervical region: Secondary | ICD-10-CM | POA: Diagnosis not present

## 2017-11-14 DIAGNOSIS — M9901 Segmental and somatic dysfunction of cervical region: Secondary | ICD-10-CM | POA: Diagnosis not present

## 2017-11-14 DIAGNOSIS — M9902 Segmental and somatic dysfunction of thoracic region: Secondary | ICD-10-CM | POA: Diagnosis not present

## 2017-11-16 DIAGNOSIS — M9901 Segmental and somatic dysfunction of cervical region: Secondary | ICD-10-CM | POA: Diagnosis not present

## 2017-11-16 DIAGNOSIS — M50322 Other cervical disc degeneration at C5-C6 level: Secondary | ICD-10-CM | POA: Diagnosis not present

## 2017-11-16 DIAGNOSIS — M5412 Radiculopathy, cervical region: Secondary | ICD-10-CM | POA: Diagnosis not present

## 2017-11-16 DIAGNOSIS — M9902 Segmental and somatic dysfunction of thoracic region: Secondary | ICD-10-CM | POA: Diagnosis not present

## 2017-12-01 DIAGNOSIS — C799 Secondary malignant neoplasm of unspecified site: Secondary | ICD-10-CM | POA: Diagnosis not present

## 2017-12-01 DIAGNOSIS — K523 Indeterminate colitis: Secondary | ICD-10-CM | POA: Diagnosis not present

## 2017-12-08 DIAGNOSIS — K523 Indeterminate colitis: Secondary | ICD-10-CM | POA: Diagnosis not present

## 2017-12-08 DIAGNOSIS — R51 Headache: Secondary | ICD-10-CM | POA: Diagnosis not present

## 2017-12-08 DIAGNOSIS — C7989 Secondary malignant neoplasm of other specified sites: Secondary | ICD-10-CM | POA: Diagnosis not present

## 2017-12-08 DIAGNOSIS — C4351 Malignant melanoma of anal skin: Secondary | ICD-10-CM | POA: Diagnosis not present

## 2017-12-08 DIAGNOSIS — C799 Secondary malignant neoplasm of unspecified site: Secondary | ICD-10-CM | POA: Diagnosis not present

## 2017-12-08 DIAGNOSIS — F17210 Nicotine dependence, cigarettes, uncomplicated: Secondary | ICD-10-CM | POA: Diagnosis not present

## 2017-12-08 DIAGNOSIS — E236 Other disorders of pituitary gland: Secondary | ICD-10-CM | POA: Diagnosis not present

## 2017-12-08 DIAGNOSIS — E2749 Other adrenocortical insufficiency: Secondary | ICD-10-CM | POA: Diagnosis not present

## 2017-12-18 ENCOUNTER — Encounter: Payer: Self-pay | Admitting: Emergency Medicine

## 2017-12-18 ENCOUNTER — Emergency Department
Admission: EM | Admit: 2017-12-18 | Discharge: 2017-12-18 | Disposition: A | Discharge status: Home / Self Care | Payer: Federal, State, Local not specified - PPO | Source: Home / Self Care

## 2017-12-18 DIAGNOSIS — R21 Rash and other nonspecific skin eruption: Secondary | ICD-10-CM

## 2017-12-18 DIAGNOSIS — Z85820 Personal history of malignant melanoma of skin: Secondary | ICD-10-CM

## 2017-12-18 DIAGNOSIS — T451X5A Adverse effect of antineoplastic and immunosuppressive drugs, initial encounter: Secondary | ICD-10-CM

## 2017-12-18 NOTE — Discharge Instructions (Addendum)
This appears to be a nonspecific rash.  It does not appear to be hives.  It is not a contact rash.  I would expect this is either a viral exanthem or an adverse effect to your medications.  With your chemotherapy medications, I would suspect that is most likely etiology.  Hold off on the chemotherapy medicine for a couple of days and see how the rash is doing.  Continue to communicate with your physicians at [facility] and if you are getting at all worse or it is not improving over the next day or so you should go down there and see them and/or be in touch with them before resuming your chemotherapy.  Take Zyrtec (cetirizine) daily  To your cortisone medicine.  If you develop any oral or palmar lesions or breathing problems or other generalized symptoms you should get rechecked or go to an emergency room.

## 2017-12-18 NOTE — ED Provider Notes (Signed)
[facility]    CSN: [id number] Arrival date & time: 12/18/17  1049     History   Chief Complaint Chief Complaint  Patient presents with  . Rash    HPI Alley Neils is a 52 y.o. female.   HPI Patient has been having a rash since yesterday.  She did work in the garden all day the day before.  She is on 2 oral chemotherapeutic medications for a recent melanoma.  She is managed by [facility].  She sent pictures to [facility] and they said it is known that the chemo can cause some rashes.  She has not had any itching or systemic symptoms other than not having felt very energetic the last couple of days.  No oral or palmar lesions  reported. Past Medical History:  Diagnosis Date  . Depression   . Melanoma of anal canal [facility])     Patient Active Problem List   Diagnosis Date Noted  . Depression     Past Surgical History:  Procedure Laterality Date  . DILATION AND CURETTAGE OF UTERUS      OB History   None      Home Medications    Prior to Admission medications   Medication Sig Start Date End Date Taking? Authorizing Provider  ibuprofen (ADVIL,MOTRIN) 800 MG tablet  11/10/17   [provider]  MEKINIST 0.5 MG tablet  11/09/17   [provider]  methocarbamol (ROBAXIN) 500 MG tablet Take 1 tablet (500 mg total) by mouth 2 (two) times daily. 11/13/17   Noe Gens, PA-C  Multiple Vitamins-Minerals (MULTIVITAMIN WITH MINERALS) tablet Take 1 tablet by mouth daily.    [provider]  predniSONE (DELTASONE) 20 MG tablet Take by mouth.    [provider]  propranolol (INDERAL) 60 MG tablet Take 60 mg by mouth 3 (three) times daily.    [provider]  rosuvastatin (CRESTOR) 10 MG tablet Take by mouth.    [provider]  simvastatin (ZOCOR) 10 MG tablet Take by mouth. 08/16/16   [provider]  TAFINLAR 50 MG capsule  11/09/17   [provider]  traMADol (ULTRAM) 50 MG tablet Take 1 tablet (50 mg total) by  mouth every 6 (six) hours as needed. 11/13/17   Noe Gens, PA-C  valACYclovir (VALTREX) 1000 MG tablet Take 1 tablet (1,000 mg total) by mouth 3 (three) times daily. 11/13/17   Noe Gens, PA-C    Family History History reviewed. No pertinent family history.  Social History Social History   Tobacco Use  . Smoking status: Current Every Day Smoker    Packs/day: 0.50    Years: 20.00    Pack years: 10.00    Types: Cigarettes  . Smokeless tobacco: Never Used  Substance Use Topics  . Alcohol use: No  . Drug use: No     Allergies   Hydromorphone; Morphine; and Oxycodone   Review of Systems Review of Systems Unremarkable other than noted above.  Physical Exam Triage Vital Signs ED Triage Vitals  Enc Vitals Group     BP 12/18/17 1200 108/76     Pulse Rate 12/18/17 1200 84     Resp --      Temp 12/18/17 1200 98.5 F (36.9 C)     Temp Source 12/18/17 1200 Oral     SpO2 12/18/17 1200 96 %     Weight 12/18/17 1201 180 lb (81.6 kg)     Height --  Head Circumference --      Peak Flow --      Pain Score 12/18/17 1201 0     Pain Loc --      Pain Edu? --      Excl. in [place]? --    No data found.  Updated Vital Signs BP 108/76 (BP Location: Right Arm)   Pulse 84   Temp 98.5 F (36.9 C) (Oral)   Wt 180 lb (81.6 kg)   SpO2 96%   BMI 32.92 kg/m   Visual Acuity Right Eye Distance:   Left Eye Distance:   Bilateral Distance:    Right Eye Near:   Left Eye Near:    Bilateral Near:     Physical Exam Diffuse fine macular rash on face, trunk, and extremities.  Palms and soles are spared.  Throat looks clear with no oral lesions.  Conjunctiva are not involved.  UC Treatments / Results  Labs (all labs ordered are listed, but only abnormal results are displayed) Labs Reviewed - No data to display  EKG None  Radiology No results found.  Procedures Procedures (including critical care time)  Medications Ordered in UC Medications - No data to  display  Initial Impression / Assessment and Plan / UC Course  I have reviewed the triage vital signs and the nursing notes.  Pertinent labs & imaging results that were available during my care of the patient were reviewed by me and considered in my medical decision making (see chart for details).     Discussed various possibilities with the patient, including primarily a reaction to the chemotherapeutic agents or a viral exanthem.  See instructions. Final Clinical Impressions(s) / UC Diagnoses   Final diagnoses:  Rash and nonspecific skin eruption  History of melanoma  Adverse effect of chemotherapy, initial encounter     Discharge Instructions     This appears to be a nonspecific rash.  It does not appear to be hives.  It is not a contact rash.  I would expect this is either a viral exanthem or an adverse effect to your medications.  With your chemotherapy medications, I would suspect that is most likely etiology.  Hold off on the chemotherapy medicine for a couple of days and see how the rash is doing.  Continue to communicate with your physicians at [facility] and if you are getting at all worse or it is not improving over the next day or so you should go down there and see them and/or be in touch with them before resuming your chemotherapy.  Take Zyrtec (cetirizine) daily  To your cortisone medicine.  If you develop any oral or palmar lesions or breathing problems or other generalized symptoms you should get rechecked or go to an emergency room.    ED Prescriptions    None     Controlled Substance Prescriptions [place] Controlled Substance Registry consulted? No   Posey Boyer, MD 12/18/17 [phone]

## 2017-12-18 NOTE — ED Triage Notes (Signed)
Pt c/o rash on entire body x2 days. States getting worse. Denies itching or pain.

## 2017-12-20 ENCOUNTER — Telehealth: Payer: Self-pay

## 2017-12-20 NOTE — Telephone Encounter (Signed)
Rash has resolved.  Has been very fatigued.  Is going to follow up with oncologist.

## 2017-12-22 DIAGNOSIS — Z79899 Other long term (current) drug therapy: Secondary | ICD-10-CM | POA: Diagnosis not present

## 2017-12-22 DIAGNOSIS — R51 Headache: Secondary | ICD-10-CM | POA: Diagnosis not present

## 2017-12-22 DIAGNOSIS — E2740 Unspecified adrenocortical insufficiency: Secondary | ICD-10-CM | POA: Diagnosis not present

## 2017-12-22 DIAGNOSIS — L270 Generalized skin eruption due to drugs and medicaments taken internally: Secondary | ICD-10-CM | POA: Diagnosis not present

## 2017-12-22 DIAGNOSIS — E860 Dehydration: Secondary | ICD-10-CM | POA: Diagnosis not present

## 2017-12-22 DIAGNOSIS — F17210 Nicotine dependence, cigarettes, uncomplicated: Secondary | ICD-10-CM | POA: Diagnosis not present

## 2017-12-22 DIAGNOSIS — C799 Secondary malignant neoplasm of unspecified site: Secondary | ICD-10-CM | POA: Diagnosis not present

## 2017-12-22 DIAGNOSIS — K523 Indeterminate colitis: Secondary | ICD-10-CM | POA: Diagnosis not present

## 2017-12-22 DIAGNOSIS — C4351 Malignant melanoma of anal skin: Secondary | ICD-10-CM | POA: Diagnosis not present

## 2017-12-28 DIAGNOSIS — H6123 Impacted cerumen, bilateral: Secondary | ICD-10-CM | POA: Diagnosis not present

## 2017-12-28 DIAGNOSIS — C4351 Malignant melanoma of anal skin: Secondary | ICD-10-CM | POA: Diagnosis not present

## 2017-12-28 DIAGNOSIS — H9193 Unspecified hearing loss, bilateral: Secondary | ICD-10-CM | POA: Diagnosis not present

## 2018-01-01 DIAGNOSIS — M9902 Segmental and somatic dysfunction of thoracic region: Secondary | ICD-10-CM | POA: Diagnosis not present

## 2018-01-01 DIAGNOSIS — L918 Other hypertrophic disorders of the skin: Secondary | ICD-10-CM | POA: Diagnosis not present

## 2018-01-01 DIAGNOSIS — D225 Melanocytic nevi of trunk: Secondary | ICD-10-CM | POA: Diagnosis not present

## 2018-01-01 DIAGNOSIS — M9903 Segmental and somatic dysfunction of lumbar region: Secondary | ICD-10-CM | POA: Diagnosis not present

## 2018-01-01 DIAGNOSIS — M50322 Other cervical disc degeneration at C5-C6 level: Secondary | ICD-10-CM | POA: Diagnosis not present

## 2018-01-01 DIAGNOSIS — Z85820 Personal history of malignant melanoma of skin: Secondary | ICD-10-CM | POA: Diagnosis not present

## 2018-01-01 DIAGNOSIS — M9901 Segmental and somatic dysfunction of cervical region: Secondary | ICD-10-CM | POA: Diagnosis not present

## 2018-01-01 DIAGNOSIS — Z08 Encounter for follow-up examination after completed treatment for malignant neoplasm: Secondary | ICD-10-CM | POA: Diagnosis not present

## 2018-01-03 DIAGNOSIS — K829 Disease of gallbladder, unspecified: Secondary | ICD-10-CM | POA: Diagnosis not present

## 2018-01-03 DIAGNOSIS — C799 Secondary malignant neoplasm of unspecified site: Secondary | ICD-10-CM | POA: Diagnosis not present

## 2018-01-03 DIAGNOSIS — C4351 Malignant melanoma of anal skin: Secondary | ICD-10-CM | POA: Diagnosis not present

## 2018-01-03 DIAGNOSIS — R918 Other nonspecific abnormal finding of lung field: Secondary | ICD-10-CM | POA: Diagnosis not present

## 2018-01-03 DIAGNOSIS — R110 Nausea: Secondary | ICD-10-CM | POA: Diagnosis not present

## 2018-01-03 DIAGNOSIS — G9389 Other specified disorders of brain: Secondary | ICD-10-CM | POA: Diagnosis not present

## 2018-01-05 DIAGNOSIS — C4351 Malignant melanoma of anal skin: Secondary | ICD-10-CM | POA: Diagnosis not present

## 2018-01-05 DIAGNOSIS — C799 Secondary malignant neoplasm of unspecified site: Secondary | ICD-10-CM | POA: Diagnosis not present

## 2018-01-05 DIAGNOSIS — C7931 Secondary malignant neoplasm of brain: Secondary | ICD-10-CM | POA: Diagnosis not present

## 2018-01-09 DIAGNOSIS — C7931 Secondary malignant neoplasm of brain: Secondary | ICD-10-CM | POA: Diagnosis not present

## 2018-01-16 DIAGNOSIS — C7931 Secondary malignant neoplasm of brain: Secondary | ICD-10-CM | POA: Diagnosis not present

## 2018-01-17 DIAGNOSIS — C7931 Secondary malignant neoplasm of brain: Secondary | ICD-10-CM | POA: Diagnosis not present

## 2018-02-02 DIAGNOSIS — C799 Secondary malignant neoplasm of unspecified site: Secondary | ICD-10-CM | POA: Diagnosis not present

## 2018-03-05 DIAGNOSIS — K080 Exfoliation of teeth due to systemic causes: Secondary | ICD-10-CM | POA: Diagnosis not present

## 2018-03-09 DIAGNOSIS — C4351 Malignant melanoma of anal skin: Secondary | ICD-10-CM | POA: Diagnosis not present

## 2018-03-09 DIAGNOSIS — Z79899 Other long term (current) drug therapy: Secondary | ICD-10-CM | POA: Diagnosis not present

## 2018-03-09 DIAGNOSIS — R030 Elevated blood-pressure reading, without diagnosis of hypertension: Secondary | ICD-10-CM | POA: Diagnosis not present

## 2018-03-09 DIAGNOSIS — C799 Secondary malignant neoplasm of unspecified site: Secondary | ICD-10-CM | POA: Diagnosis not present

## 2018-03-09 DIAGNOSIS — E2740 Unspecified adrenocortical insufficiency: Secondary | ICD-10-CM | POA: Diagnosis not present

## 2018-03-09 DIAGNOSIS — Z720 Tobacco use: Secondary | ICD-10-CM | POA: Diagnosis not present

## 2018-03-09 DIAGNOSIS — B370 Candidal stomatitis: Secondary | ICD-10-CM | POA: Diagnosis not present

## 2018-03-09 DIAGNOSIS — Z9289 Personal history of other medical treatment: Secondary | ICD-10-CM | POA: Diagnosis not present

## 2018-04-06 DIAGNOSIS — C4351 Malignant melanoma of anal skin: Secondary | ICD-10-CM | POA: Diagnosis not present

## 2018-04-06 DIAGNOSIS — Z9289 Personal history of other medical treatment: Secondary | ICD-10-CM | POA: Diagnosis not present

## 2018-04-06 DIAGNOSIS — Z79899 Other long term (current) drug therapy: Secondary | ICD-10-CM | POA: Diagnosis not present

## 2018-04-06 DIAGNOSIS — C7931 Secondary malignant neoplasm of brain: Secondary | ICD-10-CM | POA: Diagnosis not present

## 2018-04-06 DIAGNOSIS — C799 Secondary malignant neoplasm of unspecified site: Secondary | ICD-10-CM | POA: Diagnosis not present

## 2018-04-06 DIAGNOSIS — E2740 Unspecified adrenocortical insufficiency: Secondary | ICD-10-CM | POA: Diagnosis not present

## 2018-04-20 DIAGNOSIS — C7940 Secondary malignant neoplasm of unspecified part of nervous system: Secondary | ICD-10-CM | POA: Diagnosis not present

## 2018-04-20 DIAGNOSIS — C799 Secondary malignant neoplasm of unspecified site: Secondary | ICD-10-CM | POA: Diagnosis not present

## 2018-04-20 DIAGNOSIS — K829 Disease of gallbladder, unspecified: Secondary | ICD-10-CM | POA: Diagnosis not present

## 2018-04-20 DIAGNOSIS — R918 Other nonspecific abnormal finding of lung field: Secondary | ICD-10-CM | POA: Diagnosis not present

## 2018-04-20 DIAGNOSIS — C4351 Malignant melanoma of anal skin: Secondary | ICD-10-CM | POA: Diagnosis not present

## 2018-04-20 DIAGNOSIS — C439 Malignant melanoma of skin, unspecified: Secondary | ICD-10-CM | POA: Diagnosis not present

## 2018-04-20 DIAGNOSIS — C7931 Secondary malignant neoplasm of brain: Secondary | ICD-10-CM | POA: Diagnosis not present

## 2018-05-02 DIAGNOSIS — K080 Exfoliation of teeth due to systemic causes: Secondary | ICD-10-CM | POA: Diagnosis not present

## 2018-05-07 DIAGNOSIS — K829 Disease of gallbladder, unspecified: Secondary | ICD-10-CM | POA: Diagnosis not present

## 2018-05-07 DIAGNOSIS — C799 Secondary malignant neoplasm of unspecified site: Secondary | ICD-10-CM | POA: Diagnosis not present

## 2018-05-07 DIAGNOSIS — Z85820 Personal history of malignant melanoma of skin: Secondary | ICD-10-CM | POA: Diagnosis not present

## 2018-05-07 DIAGNOSIS — C7889 Secondary malignant neoplasm of other digestive organs: Secondary | ICD-10-CM | POA: Diagnosis not present

## 2018-06-15 DIAGNOSIS — K829 Disease of gallbladder, unspecified: Secondary | ICD-10-CM | POA: Diagnosis not present

## 2018-06-15 DIAGNOSIS — R918 Other nonspecific abnormal finding of lung field: Secondary | ICD-10-CM | POA: Diagnosis not present

## 2018-06-15 DIAGNOSIS — C439 Malignant melanoma of skin, unspecified: Secondary | ICD-10-CM | POA: Diagnosis not present

## 2018-06-15 DIAGNOSIS — C799 Secondary malignant neoplasm of unspecified site: Secondary | ICD-10-CM | POA: Diagnosis not present

## 2018-06-22 DIAGNOSIS — E2749 Other adrenocortical insufficiency: Secondary | ICD-10-CM | POA: Diagnosis not present

## 2018-06-22 DIAGNOSIS — C7931 Secondary malignant neoplasm of brain: Secondary | ICD-10-CM | POA: Diagnosis not present

## 2018-06-22 DIAGNOSIS — R918 Other nonspecific abnormal finding of lung field: Secondary | ICD-10-CM | POA: Diagnosis not present

## 2018-06-22 DIAGNOSIS — F17210 Nicotine dependence, cigarettes, uncomplicated: Secondary | ICD-10-CM | POA: Diagnosis not present

## 2018-06-22 DIAGNOSIS — C4351 Malignant melanoma of anal skin: Secondary | ICD-10-CM | POA: Diagnosis not present

## 2018-06-22 DIAGNOSIS — R590 Localized enlarged lymph nodes: Secondary | ICD-10-CM | POA: Diagnosis not present

## 2018-06-22 DIAGNOSIS — C799 Secondary malignant neoplasm of unspecified site: Secondary | ICD-10-CM | POA: Diagnosis not present

## 2018-06-22 DIAGNOSIS — C7889 Secondary malignant neoplasm of other digestive organs: Secondary | ICD-10-CM | POA: Diagnosis not present

## 2018-07-03 DIAGNOSIS — C4351 Malignant melanoma of anal skin: Secondary | ICD-10-CM | POA: Diagnosis not present

## 2018-07-03 DIAGNOSIS — C801 Malignant (primary) neoplasm, unspecified: Secondary | ICD-10-CM | POA: Diagnosis not present

## 2018-07-03 DIAGNOSIS — C799 Secondary malignant neoplasm of unspecified site: Secondary | ICD-10-CM | POA: Diagnosis not present

## 2018-07-03 DIAGNOSIS — C7931 Secondary malignant neoplasm of brain: Secondary | ICD-10-CM | POA: Diagnosis not present

## 2018-07-03 DIAGNOSIS — I517 Cardiomegaly: Secondary | ICD-10-CM | POA: Diagnosis not present

## 2018-07-16 DIAGNOSIS — Z8041 Family history of malignant neoplasm of ovary: Secondary | ICD-10-CM | POA: Diagnosis not present

## 2018-07-16 DIAGNOSIS — Z803 Family history of malignant neoplasm of breast: Secondary | ICD-10-CM | POA: Diagnosis not present

## 2018-07-16 DIAGNOSIS — F17210 Nicotine dependence, cigarettes, uncomplicated: Secondary | ICD-10-CM | POA: Diagnosis not present

## 2018-07-16 DIAGNOSIS — E236 Other disorders of pituitary gland: Secondary | ICD-10-CM | POA: Diagnosis not present

## 2018-07-16 DIAGNOSIS — C7889 Secondary malignant neoplasm of other digestive organs: Secondary | ICD-10-CM | POA: Diagnosis not present

## 2018-07-16 DIAGNOSIS — C7931 Secondary malignant neoplasm of brain: Secondary | ICD-10-CM | POA: Diagnosis not present

## 2018-07-16 DIAGNOSIS — C4351 Malignant melanoma of anal skin: Secondary | ICD-10-CM | POA: Diagnosis not present

## 2018-07-16 DIAGNOSIS — C210 Malignant neoplasm of anus, unspecified: Secondary | ICD-10-CM | POA: Diagnosis not present

## 2018-07-16 DIAGNOSIS — Z006 Encounter for examination for normal comparison and control in clinical research program: Secondary | ICD-10-CM | POA: Diagnosis not present

## 2018-07-17 DIAGNOSIS — C4351 Malignant melanoma of anal skin: Secondary | ICD-10-CM | POA: Diagnosis not present

## 2018-07-17 DIAGNOSIS — R112 Nausea with vomiting, unspecified: Secondary | ICD-10-CM | POA: Diagnosis not present

## 2018-07-17 DIAGNOSIS — Z79899 Other long term (current) drug therapy: Secondary | ICD-10-CM | POA: Diagnosis not present

## 2018-07-17 DIAGNOSIS — C7931 Secondary malignant neoplasm of brain: Secondary | ICD-10-CM | POA: Diagnosis not present

## 2018-07-17 DIAGNOSIS — R51 Headache: Secondary | ICD-10-CM | POA: Diagnosis not present

## 2018-07-17 DIAGNOSIS — F17210 Nicotine dependence, cigarettes, uncomplicated: Secondary | ICD-10-CM | POA: Diagnosis not present

## 2018-07-17 DIAGNOSIS — R4182 Altered mental status, unspecified: Secondary | ICD-10-CM | POA: Diagnosis not present

## 2018-07-17 DIAGNOSIS — Z006 Encounter for examination for normal comparison and control in clinical research program: Secondary | ICD-10-CM | POA: Diagnosis not present

## 2018-07-17 DIAGNOSIS — Z9889 Other specified postprocedural states: Secondary | ICD-10-CM | POA: Diagnosis not present

## 2018-07-17 DIAGNOSIS — I959 Hypotension, unspecified: Secondary | ICD-10-CM | POA: Diagnosis not present

## 2018-07-17 DIAGNOSIS — D490 Neoplasm of unspecified behavior of digestive system: Secondary | ICD-10-CM | POA: Diagnosis not present

## 2018-07-17 DIAGNOSIS — Z5181 Encounter for therapeutic drug level monitoring: Secondary | ICD-10-CM | POA: Diagnosis not present

## 2018-07-17 DIAGNOSIS — R000 Tachycardia, unspecified: Secondary | ICD-10-CM | POA: Diagnosis not present

## 2018-07-17 DIAGNOSIS — R509 Fever, unspecified: Secondary | ICD-10-CM | POA: Diagnosis not present

## 2018-07-24 DIAGNOSIS — M9901 Segmental and somatic dysfunction of cervical region: Secondary | ICD-10-CM | POA: Diagnosis not present

## 2018-07-24 DIAGNOSIS — M50322 Other cervical disc degeneration at C5-C6 level: Secondary | ICD-10-CM | POA: Diagnosis not present

## 2018-07-24 DIAGNOSIS — M9902 Segmental and somatic dysfunction of thoracic region: Secondary | ICD-10-CM | POA: Diagnosis not present

## 2018-07-24 DIAGNOSIS — M9903 Segmental and somatic dysfunction of lumbar region: Secondary | ICD-10-CM | POA: Diagnosis not present

## 2018-07-25 DIAGNOSIS — C7931 Secondary malignant neoplasm of brain: Secondary | ICD-10-CM | POA: Diagnosis not present

## 2018-08-06 DIAGNOSIS — E236 Other disorders of pituitary gland: Secondary | ICD-10-CM | POA: Diagnosis not present

## 2018-08-06 DIAGNOSIS — K829 Disease of gallbladder, unspecified: Secondary | ICD-10-CM | POA: Diagnosis not present

## 2018-08-06 DIAGNOSIS — E2749 Other adrenocortical insufficiency: Secondary | ICD-10-CM | POA: Diagnosis not present

## 2018-08-06 DIAGNOSIS — C439 Malignant melanoma of skin, unspecified: Secondary | ICD-10-CM | POA: Diagnosis not present

## 2018-08-06 DIAGNOSIS — Z720 Tobacco use: Secondary | ICD-10-CM | POA: Diagnosis not present

## 2018-08-06 DIAGNOSIS — R21 Rash and other nonspecific skin eruption: Secondary | ICD-10-CM | POA: Diagnosis not present

## 2018-08-06 DIAGNOSIS — Z79899 Other long term (current) drug therapy: Secondary | ICD-10-CM | POA: Diagnosis not present

## 2018-08-06 DIAGNOSIS — C4351 Malignant melanoma of anal skin: Secondary | ICD-10-CM | POA: Diagnosis not present

## 2018-08-06 DIAGNOSIS — Z006 Encounter for examination for normal comparison and control in clinical research program: Secondary | ICD-10-CM | POA: Diagnosis not present

## 2018-08-06 DIAGNOSIS — R110 Nausea: Secondary | ICD-10-CM | POA: Diagnosis not present

## 2018-08-06 DIAGNOSIS — Z5111 Encounter for antineoplastic chemotherapy: Secondary | ICD-10-CM | POA: Diagnosis not present

## 2018-08-06 DIAGNOSIS — K529 Noninfective gastroenteritis and colitis, unspecified: Secondary | ICD-10-CM | POA: Diagnosis not present

## 2018-08-27 DIAGNOSIS — E2749 Other adrenocortical insufficiency: Secondary | ICD-10-CM | POA: Diagnosis not present

## 2018-08-27 DIAGNOSIS — C7931 Secondary malignant neoplasm of brain: Secondary | ICD-10-CM | POA: Diagnosis not present

## 2018-08-27 DIAGNOSIS — C434 Malignant melanoma of scalp and neck: Secondary | ICD-10-CM | POA: Diagnosis not present

## 2018-08-27 DIAGNOSIS — E236 Other disorders of pituitary gland: Secondary | ICD-10-CM | POA: Diagnosis not present

## 2018-08-27 DIAGNOSIS — C4351 Malignant melanoma of anal skin: Secondary | ICD-10-CM | POA: Diagnosis not present

## 2018-08-27 DIAGNOSIS — Z5111 Encounter for antineoplastic chemotherapy: Secondary | ICD-10-CM | POA: Diagnosis not present

## 2018-08-27 DIAGNOSIS — Z006 Encounter for examination for normal comparison and control in clinical research program: Secondary | ICD-10-CM | POA: Diagnosis not present

## 2018-08-27 DIAGNOSIS — F17210 Nicotine dependence, cigarettes, uncomplicated: Secondary | ICD-10-CM | POA: Diagnosis not present

## 2018-09-03 DIAGNOSIS — C4351 Malignant melanoma of anal skin: Secondary | ICD-10-CM | POA: Diagnosis not present

## 2018-09-03 DIAGNOSIS — C7931 Secondary malignant neoplasm of brain: Secondary | ICD-10-CM | POA: Diagnosis not present

## 2018-09-03 DIAGNOSIS — Z006 Encounter for examination for normal comparison and control in clinical research program: Secondary | ICD-10-CM | POA: Diagnosis not present

## 2018-09-03 DIAGNOSIS — R69 Illness, unspecified: Secondary | ICD-10-CM | POA: Diagnosis not present

## 2018-09-12 DIAGNOSIS — C439 Malignant melanoma of skin, unspecified: Secondary | ICD-10-CM | POA: Diagnosis not present

## 2018-09-12 DIAGNOSIS — C7931 Secondary malignant neoplasm of brain: Secondary | ICD-10-CM | POA: Diagnosis not present

## 2018-09-12 DIAGNOSIS — R6889 Other general symptoms and signs: Secondary | ICD-10-CM | POA: Diagnosis not present

## 2018-09-12 DIAGNOSIS — R918 Other nonspecific abnormal finding of lung field: Secondary | ICD-10-CM | POA: Diagnosis not present

## 2018-09-12 DIAGNOSIS — Z006 Encounter for examination for normal comparison and control in clinical research program: Secondary | ICD-10-CM | POA: Diagnosis not present

## 2018-09-12 DIAGNOSIS — C799 Secondary malignant neoplasm of unspecified site: Secondary | ICD-10-CM | POA: Diagnosis not present

## 2018-09-21 DIAGNOSIS — Z006 Encounter for examination for normal comparison and control in clinical research program: Secondary | ICD-10-CM | POA: Diagnosis not present

## 2018-09-21 DIAGNOSIS — E2749 Other adrenocortical insufficiency: Secondary | ICD-10-CM | POA: Diagnosis not present

## 2018-09-21 DIAGNOSIS — C4351 Malignant melanoma of anal skin: Secondary | ICD-10-CM | POA: Diagnosis not present

## 2018-09-21 DIAGNOSIS — R110 Nausea: Secondary | ICD-10-CM | POA: Diagnosis not present

## 2018-09-21 DIAGNOSIS — C799 Secondary malignant neoplasm of unspecified site: Secondary | ICD-10-CM | POA: Diagnosis not present

## 2018-09-21 DIAGNOSIS — R21 Rash and other nonspecific skin eruption: Secondary | ICD-10-CM | POA: Diagnosis not present

## 2018-09-21 DIAGNOSIS — F17210 Nicotine dependence, cigarettes, uncomplicated: Secondary | ICD-10-CM | POA: Diagnosis not present

## 2018-09-21 DIAGNOSIS — K529 Noninfective gastroenteritis and colitis, unspecified: Secondary | ICD-10-CM | POA: Diagnosis not present

## 2018-09-21 DIAGNOSIS — C7911 Secondary malignant neoplasm of bladder: Secondary | ICD-10-CM | POA: Diagnosis not present

## 2018-09-21 DIAGNOSIS — Z79899 Other long term (current) drug therapy: Secondary | ICD-10-CM | POA: Diagnosis not present

## 2018-09-21 DIAGNOSIS — E236 Other disorders of pituitary gland: Secondary | ICD-10-CM | POA: Diagnosis not present

## 2018-09-27 DIAGNOSIS — C7931 Secondary malignant neoplasm of brain: Secondary | ICD-10-CM | POA: Diagnosis not present

## 2018-09-28 DIAGNOSIS — F17200 Nicotine dependence, unspecified, uncomplicated: Secondary | ICD-10-CM | POA: Diagnosis not present

## 2018-10-12 DIAGNOSIS — F17200 Nicotine dependence, unspecified, uncomplicated: Secondary | ICD-10-CM | POA: Diagnosis not present

## 2018-10-19 DIAGNOSIS — C799 Secondary malignant neoplasm of unspecified site: Secondary | ICD-10-CM | POA: Diagnosis not present

## 2018-10-25 DIAGNOSIS — M9902 Segmental and somatic dysfunction of thoracic region: Secondary | ICD-10-CM | POA: Diagnosis not present

## 2018-10-25 DIAGNOSIS — M9901 Segmental and somatic dysfunction of cervical region: Secondary | ICD-10-CM | POA: Diagnosis not present

## 2018-10-25 DIAGNOSIS — M9903 Segmental and somatic dysfunction of lumbar region: Secondary | ICD-10-CM | POA: Diagnosis not present

## 2018-10-25 DIAGNOSIS — M50322 Other cervical disc degeneration at C5-C6 level: Secondary | ICD-10-CM | POA: Diagnosis not present

## 2018-11-02 DIAGNOSIS — F17200 Nicotine dependence, unspecified, uncomplicated: Secondary | ICD-10-CM | POA: Diagnosis not present

## 2018-11-15 DIAGNOSIS — F17210 Nicotine dependence, cigarettes, uncomplicated: Secondary | ICD-10-CM | POA: Diagnosis not present

## 2018-11-16 DIAGNOSIS — C4351 Malignant melanoma of anal skin: Secondary | ICD-10-CM | POA: Diagnosis not present

## 2018-11-16 DIAGNOSIS — C799 Secondary malignant neoplasm of unspecified site: Secondary | ICD-10-CM | POA: Diagnosis not present

## 2018-11-22 DIAGNOSIS — G43909 Migraine, unspecified, not intractable, without status migrainosus: Secondary | ICD-10-CM | POA: Diagnosis not present

## 2018-11-22 DIAGNOSIS — J309 Allergic rhinitis, unspecified: Secondary | ICD-10-CM | POA: Diagnosis not present

## 2018-11-22 DIAGNOSIS — C799 Secondary malignant neoplasm of unspecified site: Secondary | ICD-10-CM | POA: Diagnosis not present

## 2018-11-22 DIAGNOSIS — E7800 Pure hypercholesterolemia, unspecified: Secondary | ICD-10-CM | POA: Diagnosis not present

## 2018-11-22 DIAGNOSIS — M5030 Other cervical disc degeneration, unspecified cervical region: Secondary | ICD-10-CM | POA: Diagnosis not present

## 2018-11-23 DIAGNOSIS — F17200 Nicotine dependence, unspecified, uncomplicated: Secondary | ICD-10-CM | POA: Diagnosis not present

## 2018-12-07 DIAGNOSIS — F17200 Nicotine dependence, unspecified, uncomplicated: Secondary | ICD-10-CM | POA: Diagnosis not present

## 2018-12-20 DIAGNOSIS — C799 Secondary malignant neoplasm of unspecified site: Secondary | ICD-10-CM | POA: Diagnosis not present

## 2018-12-20 DIAGNOSIS — I517 Cardiomegaly: Secondary | ICD-10-CM | POA: Diagnosis not present

## 2018-12-20 DIAGNOSIS — C7931 Secondary malignant neoplasm of brain: Secondary | ICD-10-CM | POA: Diagnosis not present

## 2018-12-20 DIAGNOSIS — C439 Malignant melanoma of skin, unspecified: Secondary | ICD-10-CM | POA: Diagnosis not present

## 2018-12-20 DIAGNOSIS — C801 Malignant (primary) neoplasm, unspecified: Secondary | ICD-10-CM | POA: Diagnosis not present

## 2018-12-24 DIAGNOSIS — C7931 Secondary malignant neoplasm of brain: Secondary | ICD-10-CM | POA: Diagnosis not present

## 2018-12-27 DIAGNOSIS — F17200 Nicotine dependence, unspecified, uncomplicated: Secondary | ICD-10-CM | POA: Diagnosis not present

## 2018-12-28 DIAGNOSIS — C7889 Secondary malignant neoplasm of other digestive organs: Secondary | ICD-10-CM | POA: Diagnosis not present

## 2018-12-28 DIAGNOSIS — Z803 Family history of malignant neoplasm of breast: Secondary | ICD-10-CM | POA: Diagnosis not present

## 2018-12-28 DIAGNOSIS — R933 Abnormal findings on diagnostic imaging of other parts of digestive tract: Secondary | ICD-10-CM | POA: Diagnosis not present

## 2018-12-28 DIAGNOSIS — K130 Diseases of lips: Secondary | ICD-10-CM | POA: Diagnosis not present

## 2018-12-28 DIAGNOSIS — E2749 Other adrenocortical insufficiency: Secondary | ICD-10-CM | POA: Diagnosis not present

## 2018-12-28 DIAGNOSIS — Z87891 Personal history of nicotine dependence: Secondary | ICD-10-CM | POA: Diagnosis not present

## 2018-12-28 DIAGNOSIS — C4351 Malignant melanoma of anal skin: Secondary | ICD-10-CM | POA: Diagnosis not present

## 2018-12-28 DIAGNOSIS — R590 Localized enlarged lymph nodes: Secondary | ICD-10-CM | POA: Diagnosis not present

## 2018-12-28 DIAGNOSIS — Z79899 Other long term (current) drug therapy: Secondary | ICD-10-CM | POA: Diagnosis not present

## 2018-12-28 DIAGNOSIS — R918 Other nonspecific abnormal finding of lung field: Secondary | ICD-10-CM | POA: Diagnosis not present

## 2018-12-28 DIAGNOSIS — D259 Leiomyoma of uterus, unspecified: Secondary | ICD-10-CM | POA: Diagnosis not present

## 2018-12-28 DIAGNOSIS — E236 Other disorders of pituitary gland: Secondary | ICD-10-CM | POA: Diagnosis not present

## 2018-12-28 DIAGNOSIS — N6310 Unspecified lump in the right breast, unspecified quadrant: Secondary | ICD-10-CM | POA: Diagnosis not present

## 2018-12-28 DIAGNOSIS — C7931 Secondary malignant neoplasm of brain: Secondary | ICD-10-CM | POA: Diagnosis not present

## 2018-12-28 DIAGNOSIS — Z8041 Family history of malignant neoplasm of ovary: Secondary | ICD-10-CM | POA: Diagnosis not present

## 2018-12-28 DIAGNOSIS — C799 Secondary malignant neoplasm of unspecified site: Secondary | ICD-10-CM | POA: Diagnosis not present

## 2018-12-31 DIAGNOSIS — C7931 Secondary malignant neoplasm of brain: Secondary | ICD-10-CM | POA: Diagnosis not present

## 2019-01-04 DIAGNOSIS — N939 Abnormal uterine and vaginal bleeding, unspecified: Secondary | ICD-10-CM | POA: Diagnosis not present

## 2019-01-04 DIAGNOSIS — N859 Noninflammatory disorder of uterus, unspecified: Secondary | ICD-10-CM | POA: Diagnosis not present

## 2019-01-04 DIAGNOSIS — N938 Other specified abnormal uterine and vaginal bleeding: Secondary | ICD-10-CM | POA: Diagnosis not present

## 2019-01-08 DIAGNOSIS — N920 Excessive and frequent menstruation with regular cycle: Secondary | ICD-10-CM | POA: Diagnosis not present

## 2019-01-10 DIAGNOSIS — Z1231 Encounter for screening mammogram for malignant neoplasm of breast: Secondary | ICD-10-CM | POA: Diagnosis not present

## 2019-01-17 DIAGNOSIS — F17200 Nicotine dependence, unspecified, uncomplicated: Secondary | ICD-10-CM | POA: Diagnosis not present

## 2019-01-21 DIAGNOSIS — R928 Other abnormal and inconclusive findings on diagnostic imaging of breast: Secondary | ICD-10-CM | POA: Diagnosis not present

## 2019-01-21 DIAGNOSIS — C4351 Malignant melanoma of anal skin: Secondary | ICD-10-CM | POA: Diagnosis not present

## 2019-01-21 DIAGNOSIS — N6341 Unspecified lump in right breast, subareolar: Secondary | ICD-10-CM | POA: Diagnosis not present

## 2019-01-21 DIAGNOSIS — N6314 Unspecified lump in the right breast, lower inner quadrant: Secondary | ICD-10-CM | POA: Diagnosis not present

## 2019-01-24 DIAGNOSIS — F17200 Nicotine dependence, unspecified, uncomplicated: Secondary | ICD-10-CM | POA: Diagnosis not present

## 2019-02-08 ENCOUNTER — Encounter
Admit: 2019-02-08 | Discharge: 2019-02-08 | Payer: PRIVATE HEALTH INSURANCE | Attending: Medical Oncology | Primary: Medical Oncology

## 2019-02-08 DIAGNOSIS — D485 Neoplasm of uncertain behavior of skin: Secondary | ICD-10-CM | POA: Diagnosis not present

## 2019-02-15 ENCOUNTER — Encounter: Admit: 2019-02-15 | Discharge: 2019-02-16 | Payer: PRIVATE HEALTH INSURANCE

## 2019-02-15 ENCOUNTER — Encounter
Admit: 2019-02-15 | Discharge: 2019-02-16 | Payer: PRIVATE HEALTH INSURANCE | Attending: Hematology & Oncology | Primary: Hematology & Oncology

## 2019-02-15 ENCOUNTER — Ambulatory Visit: Admit: 2019-02-15 | Discharge: 2019-02-16 | Payer: PRIVATE HEALTH INSURANCE

## 2019-02-15 DIAGNOSIS — Z720 Tobacco use: Secondary | ICD-10-CM | POA: Diagnosis not present

## 2019-02-15 DIAGNOSIS — I10 Essential (primary) hypertension: Secondary | ICD-10-CM | POA: Diagnosis not present

## 2019-02-15 DIAGNOSIS — C799 Secondary malignant neoplasm of unspecified site: Secondary | ICD-10-CM | POA: Diagnosis not present

## 2019-02-15 DIAGNOSIS — D0351 Melanoma in situ of anal skin: Secondary | ICD-10-CM | POA: Diagnosis not present

## 2019-02-15 DIAGNOSIS — F17210 Nicotine dependence, cigarettes, uncomplicated: Secondary | ICD-10-CM | POA: Diagnosis not present

## 2019-02-15 DIAGNOSIS — C785 Secondary malignant neoplasm of large intestine and rectum: Secondary | ICD-10-CM | POA: Diagnosis not present

## 2019-02-15 DIAGNOSIS — Z923 Personal history of irradiation: Secondary | ICD-10-CM | POA: Diagnosis not present

## 2019-02-15 DIAGNOSIS — C439 Malignant melanoma of skin, unspecified: Principal | ICD-10-CM

## 2019-02-20 DIAGNOSIS — C439 Malignant melanoma of skin, unspecified: Secondary | ICD-10-CM

## 2019-02-21 DIAGNOSIS — F17200 Nicotine dependence, unspecified, uncomplicated: Secondary | ICD-10-CM | POA: Diagnosis not present

## 2019-02-22 ENCOUNTER — Encounter
Admit: 2019-02-22 | Discharge: 2019-02-22 | Payer: PRIVATE HEALTH INSURANCE | Attending: Hematology & Oncology | Primary: Hematology & Oncology

## 2019-02-22 ENCOUNTER — Encounter: Admit: 2019-02-22 | Discharge: 2019-02-22 | Payer: PRIVATE HEALTH INSURANCE

## 2019-02-22 DIAGNOSIS — Z09 Encounter for follow-up examination after completed treatment for conditions other than malignant neoplasm: Secondary | ICD-10-CM | POA: Diagnosis not present

## 2019-02-22 DIAGNOSIS — C7931 Secondary malignant neoplasm of brain: Secondary | ICD-10-CM | POA: Diagnosis not present

## 2019-02-22 DIAGNOSIS — G893 Neoplasm related pain (acute) (chronic): Secondary | ICD-10-CM | POA: Diagnosis not present

## 2019-02-22 DIAGNOSIS — Z6832 Body mass index (BMI) 32.0-32.9, adult: Secondary | ICD-10-CM | POA: Diagnosis not present

## 2019-02-22 DIAGNOSIS — C799 Secondary malignant neoplasm of unspecified site: Secondary | ICD-10-CM | POA: Diagnosis not present

## 2019-02-22 DIAGNOSIS — C439 Malignant melanoma of skin, unspecified: Secondary | ICD-10-CM | POA: Diagnosis not present

## 2019-02-22 DIAGNOSIS — Z006 Encounter for examination for normal comparison and control in clinical research program: Secondary | ICD-10-CM

## 2019-02-26 DIAGNOSIS — C439 Malignant melanoma of skin, unspecified: Secondary | ICD-10-CM

## 2019-02-27 ENCOUNTER — Encounter: Admit: 2019-02-27 | Discharge: 2019-02-27 | Payer: PRIVATE HEALTH INSURANCE

## 2019-02-27 DIAGNOSIS — C439 Malignant melanoma of skin, unspecified: Secondary | ICD-10-CM | POA: Diagnosis not present

## 2019-02-27 DIAGNOSIS — G936 Cerebral edema: Secondary | ICD-10-CM | POA: Diagnosis not present

## 2019-02-27 DIAGNOSIS — Z85820 Personal history of malignant melanoma of skin: Secondary | ICD-10-CM | POA: Diagnosis not present

## 2019-02-27 DIAGNOSIS — R918 Other nonspecific abnormal finding of lung field: Secondary | ICD-10-CM | POA: Diagnosis not present

## 2019-02-27 DIAGNOSIS — C799 Secondary malignant neoplasm of unspecified site: Secondary | ICD-10-CM | POA: Diagnosis not present

## 2019-02-27 DIAGNOSIS — Z006 Encounter for examination for normal comparison and control in clinical research program: Secondary | ICD-10-CM | POA: Diagnosis not present

## 2019-02-27 DIAGNOSIS — G9389 Other specified disorders of brain: Secondary | ICD-10-CM | POA: Diagnosis not present

## 2019-02-28 ENCOUNTER — Encounter: Admit: 2019-02-28 | Discharge: 2019-03-01 | Payer: PRIVATE HEALTH INSURANCE

## 2019-03-01 ENCOUNTER — Encounter
Admit: 2019-03-01 | Discharge: 2019-03-02 | Payer: PRIVATE HEALTH INSURANCE | Attending: Hematology & Oncology | Primary: Hematology & Oncology

## 2019-03-01 ENCOUNTER — Non-Acute Institutional Stay: Admit: 2019-03-01 | Discharge: 2019-03-02 | Payer: PRIVATE HEALTH INSURANCE

## 2019-03-01 DIAGNOSIS — Z6832 Body mass index (BMI) 32.0-32.9, adult: Secondary | ICD-10-CM | POA: Diagnosis not present

## 2019-03-01 DIAGNOSIS — Z85820 Personal history of malignant melanoma of skin: Secondary | ICD-10-CM | POA: Diagnosis not present

## 2019-03-01 DIAGNOSIS — K429 Umbilical hernia without obstruction or gangrene: Secondary | ICD-10-CM | POA: Diagnosis not present

## 2019-03-01 DIAGNOSIS — R918 Other nonspecific abnormal finding of lung field: Secondary | ICD-10-CM | POA: Diagnosis not present

## 2019-03-01 DIAGNOSIS — C792 Secondary malignant neoplasm of skin: Secondary | ICD-10-CM | POA: Diagnosis not present

## 2019-03-01 DIAGNOSIS — N6310 Unspecified lump in the right breast, unspecified quadrant: Secondary | ICD-10-CM | POA: Diagnosis not present

## 2019-03-01 DIAGNOSIS — Z79899 Other long term (current) drug therapy: Secondary | ICD-10-CM | POA: Diagnosis not present

## 2019-03-01 DIAGNOSIS — C7931 Secondary malignant neoplasm of brain: Secondary | ICD-10-CM | POA: Diagnosis not present

## 2019-03-01 DIAGNOSIS — Z789 Other specified health status: Secondary | ICD-10-CM | POA: Diagnosis not present

## 2019-03-01 DIAGNOSIS — C799 Secondary malignant neoplasm of unspecified site: Secondary | ICD-10-CM | POA: Diagnosis not present

## 2019-03-01 DIAGNOSIS — R590 Localized enlarged lymph nodes: Secondary | ICD-10-CM | POA: Diagnosis not present

## 2019-03-01 DIAGNOSIS — Z5111 Encounter for antineoplastic chemotherapy: Secondary | ICD-10-CM

## 2019-03-01 MED ORDER — BINIMETINIB 15 MG TABLET
ORAL_TABLET | Freq: Two times a day (BID) | ORAL | 5 refills | 30 days | Status: CP
Start: 2019-03-01 — End: 2019-03-12
  Filled 2019-03-05: qty 180, 30d supply, fill #0

## 2019-03-01 MED ORDER — ENCORAFENIB 75 MG CAPSULE
ORAL_CAPSULE | Freq: Every day | ORAL | 5 refills | 30.00000 days | Status: CP
Start: 2019-03-01 — End: 2019-03-12
  Filled 2019-03-05: qty 180, 30d supply, fill #0

## 2019-03-04 DIAGNOSIS — C799 Secondary malignant neoplasm of unspecified site: Secondary | ICD-10-CM

## 2019-03-05 ENCOUNTER — Encounter: Admit: 2019-03-05 | Discharge: 2019-03-06 | Payer: PRIVATE HEALTH INSURANCE | Attending: Oncology | Primary: Oncology

## 2019-03-05 DIAGNOSIS — C799 Secondary malignant neoplasm of unspecified site: Secondary | ICD-10-CM

## 2019-03-05 MED FILL — MEKTOVI 15 MG TABLET: 30 days supply | Qty: 180 | Fill #0 | Status: AC

## 2019-03-05 MED FILL — BRAFTOVI 75 MG CAPSULE: 30 days supply | Qty: 180 | Fill #0 | Status: AC

## 2019-03-05 NOTE — Unmapped (Signed)
[facility]   Patient Onboarding/Medication Counseling    Doris Kramer is a 53 y.o. female with metastatic melanoma who I am counseling today on initiation of therapy.  I am speaking to the patient's caregiver.    Verified patient's date of birth / HIPAA.    Specialty medication(s) to be sent: Hematology/Oncology: Merlene Morse      Non-specialty medications/supplies to be sent: n/a    Medications not needed at this time: n/a       Braftovi (Encorafenib)    Medication & Administration     Dosage: Take 6 capsules (450 mg total) by mouth daily.     Administration:     ? Administer orally at the same time each day.   ? May be administered with or without food.  ? If counseling for use in combination with panitumumab  o Apply moisturizing cream (e.g. Aquaphor) twice daily to the face, hands, feet, neck, back and chest.   o Apply sunscreen with SPF at least 30 prior to going out in the sun.   o Take doxycycline 100 mg PO Q12H one day before start of infusion therapy    Adherence/Missed dose instructions:   ? Take a missed dose as soon as you think about.  If it is less than 12 hours until the next dose, skip the missed dose and go back to your normal time. Do not take 2 doses at the same time or extra doses.    Lab Requirement: Confirm BRAF V600 mutation status prior to treatment initiation: BRAF V600 mutation status confirmed and documented in chart    Goals of Therapy     ? To prevent disease progression    Side Effects & Monitoring Parameters     Commonly reported side effects  ? Fatigue  ? Nausea, vomiting  ? Diarrhea  ? Constipation  ? Abdominal pain  ? Rash  ? Itching  ? Change in taste  ? Painful extremities  ? Muscle pain, joint pain, back pain  ? Hard, thick, dry skin  ? Dizziness   ? Headache  ? Hair loss  ? Hand foot syndrome  ? Nail and skin changes (including acneiform rash)    The following side effects should be reported to the provider:  ? Signs of infection (fever >100.4, chills, mouth sores, sputum production)  ? Signs of hand foot syndrome (redness or irritation of palms or soles of feet)  ? Severe or persistent loss of energy and strength  ? Signs of bleeding (vomiting or coughing up blood, blood that looks like coffee grounds, blood in the urine or black, red tarry stools, bruising that gets bigger without reason, any persistent or severe bleeding, impaired wound healing)  ? Signs of cerebrovascular disease (change in strength on one side is greater than the other, trouble speaking or thinking, change in balance, vision changes)  ? Signs of fluid and electrolyte problems (mood changes, confusion, muscle pain or weakness, abnormal/fast heartbeat, severe dizziness, passing out)  ? Signs of high blood sugar (confusion, fatigue, increased thirst, increase hunger, passing a lot of urine, flushing, fast breathing, breathe that smells like fruit)  ? Vison changes (eye pain, severe eye irritation, seeing colored dots, seeing halos or bright lights around lights)  ? Mole changes  ? Skin changes  ? Impaired skin wound healing  ? Burning or numbness feeling  ? Fast heartbeat, abnormal heartbeat or passing out  ? Signs of anaphylaxis (wheezing, chest tightness, swelling of face,  lips, tongue or throat)  ? Very bad headache    Monitoring Parameters:   ? [id number] or V600E mutation status (prior to treatment)  ? Electrolytes  ? Verify pregnancy status in females of reproductive potential prior to treatment initiation  ? Dermatologic evaluations prior to initiation, every 2 months during therapy, and for up to 6 months following discontinuation to assess for new cutaneous malignancies  ? Monitor for signs/symptoms of uveitis, hemorrhage, and dermatologic toxicity, and for non-cutaneous malignancies.   ? LFTs and Renal Function tests.  ? Ophthalmological tests  ? Monitor adherence.    Contraindications, Warnings, & Precautions     ? Dermatologic toxicity: Grade 3 or 4 dermatologic toxicity occurred in ~20% of patients receiving encorafenib as a single agent, compared to 2% of patients receiving the combination of encorafenib and binimetinib.  ? GI toxicity: Encorafenib is associated with a moderate emetic potential; antiemetics may be recommended to prevent nausea and vomiting.  ? Hemorrhage: Hemorrhage, including ? grade 3 events, may occur when administered in combination with other agents.   ? Malignancy: New primary malignancies (cutaneous and non-cutaneous) have been observed in patients treated with BRAF inhibitors and may occur with encorafenib.   ? Ocular toxicity: Uveitis has been observed with the combination of encorafenib and binimetinib.   ? QT prolongation: Encorafenib is associated with dose dependent QTc interval prolongation.   ? Appropriate use: Encorafenib is not indicated for treatment of patients with wild-type BRAF melanoma or wild-type BRAF colorectal cancer. Exposing BRAF wild-type cells to BRAF inhibitors such as encorafenib may result in paradoxical activation of MAP-kinase signaling and increased cell proliferation. Prior to initiating therapy, confirm BRAF V600E or BRAF V600K mutation status with an approved test.   ? Reproductive Considerations: Verify pregnancy status in females of reproductive potential prior to initiating encorafenib therapy. Females of reproductive potential should use a highly effective non-hormonal contraceptive during therapy and for at least 2 weeks after the last encorafenib dose; hormonal contraceptives may not be effective. Based on its mechanism of action and on findings in animal reproduction studies, encorafenib may cause fetal harm if administered during pregnancy.  ? Breastfeeding Considerations:  It is not known if encorafenib is present in breast milk.  Due to the potential for serious adverse reactions in the breastfed infant, breastfeeding is not recommended by the manufacturer during treatment and for 2 weeks after the last encorafenib dose.    Drug/Food Interactions     ? Medication list reviewed in Epic. The patient was instructed to inform the care team before taking any new medications or supplements. No drug interactions identified.   ? Avoid grapefruit and grapefruit juice (may increase encorafenib concentrations).    Storage, Handling Precautions, & Disposal     ? Store at room temperature in the original container (do not use a pillbox or store with other medications). Do not take out of the anti-moisture cube or packet.   ? Store in a dry place.  Do not store in a bathroom.    ? Caregivers helping administer medication should wear gloves and wash hands immediately after.    ? Keep the lid tightly closed. Keep out of the reach of children and pets.  ? Do not flush down a toilet or pour down a drain unless instructed to do so.  Check with your local police department or fire station about drug take-back programs in your area.      Mektovi (Binimetinib)    Medication & Administration  Dosage: Take 3 tablets (45 mg total) by mouth Two (2) times a day.    Administration:   ? Take about 12 hours apart  ? Take with or without food at the same scheduled time(s) each day for each dose.      Adherence/Missed dose instructions:  ? Do not administer a missed dose if <6 hours until the next dose.   ? Do not take an additional dose if vomiting occurs; resume with the next scheduled dose.  ? If taken in combination with paniumumab infusion:  ? Apply moisturizing cream (e.g. Aquaphor) twice daily to the face, hands, feet, neck, back and chest.   ? Apply sunscreen with SPF of at least 30 prior to sun exposure.   ? Take Doxycycline 100mg  by mouth every 12 hours starting 1 day before starting infusion    Lab Requirement (IF USED WITH BRAFTOVI): Confirm BRAF V600 mutation status prior to treatment initiation: BRAF V600 mutation status confirmed and documented in chart    Goals of Therapy     ? To prevent disease progression    Side Effects & Monitoring Parameters     Commonly reported side effects  ? Nausea  ? Vomiting  ? Diarrhea  ? Abdominal Pain  ? Constipation  ? Changes in vision  ? Peripheral edema  ? Rash  ? Fatigue    The following side effects should be reported to the provider:  ? Signs of infection (fever >100.4, chills, mouth sores, sputum production)  ? Severe or persistent loss of energy and strength  ? Severe headache, dizziness, passing out  ? Signs of blood clots (numbness or weakness on one side of the body, pain, redness, tenderness, warmth, or swelling in the arms or legs, change in color of an arm of leg, chest pain, shortness of breath, fast heartbeat)  ? Signs of bleeding (vomiting or coughing up blood, blood that looks like coffee grounds, blood in the urine or black, red tarry stools, bruising that gets bigger without reason, any persistent or severe bleeding, impaired wound healing)  ? Signs of a severe pulmonary disorder (trouble breathing, shortness of breath, a cough that is new or worse).    ? Signs of liver problems (dark urine, abdominal pain, light-colored stools, vomiting, yellow skin or eyes)  ? Signs of low sodium (head, trouble focusing, trouble with memory, confusion, weakness, seizures, change in balance)  ? Vison changes (eye pain, swelling, redness, blurred vision, blindness severe eye irritation, seeing colored dots, seeing halos or bright lights around lights)  ? Signs of heart problems (cough or shortness of breath that is new or worse, swelling of ankle of legs, abnormal heartbeat, weight gain of 5 of more lbs. in 24 hours, dizziness or passing out)  ? Signs of anaphylaxis (wheezing, chest tightness, swelling of face, lips, tongue or throat)    Monitoring Parameters: BRAFV600K or V600E mutation status (prior to treatment); monitor liver function tests prior to treatment initiation, monthly during treatment, and as clinically indicated; monitor CPK and creatinine levels prior to treatment initiation, periodically during treatment, and as clinically indicated. Verify pregnancy status in females of reproductive potential prior to treatment initiation. Assess ejection fraction, prior to treatment initiation, one month after initiation, and then every 2 to 3 months during treatment; Evaluate visual symptoms at each visit; perform an ophthalmologic examination at regular intervals and for new or worsening visual disturbances; perform ophthalmologic evaluation for patient-reported acute vision loss or other visual disturbance within 24 hours. Monitor for  signs/symptoms of dermatologic toxicity, hemorrhage, interstitial lung disease, ocular toxicity, rhabdomyolysis, and thromboembolism. Monitor adherence.    Contraindications, Warnings, & Precautions     ? Females of reproductive potential should use a highly effective contraceptive during therapy and for at least 30 days after the final dose.  ? Breastfeeding is not recommended during treatment and for 3 days after the last dose.  ? Cardiotoxicity: Cardiomyopathy has been reported in patients who received the combination of binimetinib and encorafenib.   ? GI toxicity: Binimetinib is associated with a moderate emetic potential.  ? Hemorrhage: Hemorrhage may occur with the combination of binimetinib and encorafenib, including ? grade 3 hemorrhage. The most frequent hemorrhagic events were gastrointestinal in nature. Fatal intracranial hemorrhage was also reported.   ? Hepatotoxicity: Hepatotoxicity may occur with the combination of binimetinib and encorafenib; grade 3 or 4 elevations in ALT, AST, and/or alkaline phosphatase have been reported.  ? Ocular toxicity: Serous retinopathy has occurred with the combination of binimetinib and encorafenib. Symptomatic serous retinopathy has been reported, although blindness did not occur. Uveitis has been reported in patients treated with the combination of binimetinib and encorafenib  ? Pulmonary toxicity: Cases of interstitial lung disease (ILD), including pneumonitis, developed in patients with BRAF mutation-positive melanoma receiving the combination of binimetinib and encorafenib.   ? Rhabdomyolysis: Rhabdomyolysis may occur with the combination of binimetinib and encorafenib; CPK elevations may commonly occur.   ? Thromboembolism: VTE, including PE, has occurred in patients receiving binimetinib in combination with encorafenib.   ? Appropriate use: Prior to initiating therapy, confirm BRAF V600E or BRAF V600K mutation status with an approved test.   ? Reproductive considerations: Verify pregnancy status in females of reproductive potential prior to initiating binimetinib therapy. Females of reproductive potential should use a highly effective contraceptive during therapy and for at least 30 days after the final binimetinib dose. Based on its mechanism of action and on findings in animal reproduction studies, binimetinib may cause fetal harm if administered during pregnancy.  ? Breastfeeding considerations: It is not known if binimetinib is present in breast milk.  Due to the potential for serious adverse reactions in the breastfed infant, breastfeeding is not recommended by the manufacturer during treatment and for 3 days after the last binimetinib dose.    Drug/Food Interactions     ? Medication list reviewed in Epic. The patient was instructed to inform the care team before taking any new medications or supplements. No drug interactions identified.     Storage, Handling Precautions, & Disposal     ? Store at room temperature in the original container (do not use a pillbox or store with other medications).   ? Store in a dry place.  Do not store in a bathroom.    ? Caregivers helping administer medication should wear gloves and wash hands immediately after.  Store at room temperature in a dry location.  ? Keep away from children and pets.  ? Throw away unused or expired drugs. There may be drug take-back programs in your area.Keep the lid tightly closed. Keep out of the reach of children and pets.  ? Do not flush down a toilet or pour down a drain unless instructed to do so.  Check with your local police department or fire station about drug take-back programs in your area.        Current Medications (including OTC/herbals), Comorbidities and Allergies     Current Outpatient Medications   Medication Sig Dispense Refill   ??? acetaminophen (TYLENOL)  325 MG tablet Take 650 mg by mouth every four (4) hours as needed.     ??? binimetinib (MEKTOVI) 15 mg tablet Take 3 tablets (45 mg total) by mouth Two (2) times a day. 180 tablet 5   ??? encorafenib (BRAFTOVI) 75 mg capsule Take 6 capsules (450 mg total) by mouth daily. 180 capsule 5   ??? fluticasone propionate (FLONASE) 50 mcg/actuation nasal spray      ??? hydrocortisone (CORTEF) 10 MG tablet      ??? nicotine (NICODERM CQ) 21 mg/24 hr patch Place 1 patch on the skin daily.     ??? nicotine polacrilex (NICORETTE MINI) lozenge 4 MG      ??? nicotine polacrilex (NICORETTE) 4 MG gum Start 4 weeks prior to Quit Day. Chew gum a few times then place between cheek and gum.Use every 15 minutes as needed for urges     ??? propranoloL (INDERAL) 60 MG tablet Take 60 mg by mouth Three (3) times a day.       No current facility-administered medications for this visit.        Allergies   Allergen Reactions   ??? Dilaudid [Hydromorphone (Bulk)] Nausea And Vomiting     Headache     ??? Morphine Nausea And Vomiting     headache   ??? Oxycodone Nausea And Vomiting     Headache         Patient Active Problem List   Diagnosis   ??? Metastatic melanoma (CMS-HCC)   ??? Tobacco use   ??? Hypertension       Reviewed and up to date in Epic.    Appropriateness of Therapy     Is medication and dose appropriate based on diagnosis? Yes    Baseline Quality of Life Assessment      How many days over the past month did your condition keep you from your normal activities? 0    Financial Information     Medication Assistance provided: Prior Authorization    Anticipated copay of $0 reviewed with patient. Verified delivery address.    Delivery Information     Scheduled delivery date: 03/06/19    Expected start date: 03/06/19    Medication will be delivered via UPS to the home address in [facility] [place] [facility].  This shipment will not require a signature.      Explained the services we provide at [facility] and that each month we would call to set up refills.  Stressed importance of returning phone calls so that we could ensure they receive their medications in time each month.  Informed patient that we should be setting up refills 7-10 days prior to when they will run out of medication.  A pharmacist will reach out to perform a clinical assessment periodically.  Informed patient that a welcome packet and a drug information handout will be sent.      Patient verbalized understanding of the above information as well as how to contact the pharmacy at [phone] option 4 with any questions/concerns.  The pharmacy is open Monday through Friday 8:30am-4:30pm.  A pharmacist is available 24/7 via pager to answer any clinical questions they may have.    Patient Specific Needs     ? Does the patient have any physical, cognitive, or cultural barriers? No    ? Patient prefers to have medications discussed with  Patient     ? Is the patient able to read and understand education materials at a high school level  or above? Yes    ? Patient's primary language is  English     ? Is the patient high risk? No     ? Does the patient require a Care Management Plan? No     ? Does the patient require physician intervention or other additional services (i.e. nutrition, smoking cessation, social work)? No      Laini Urick  Anders Grant  [facility] Specialty Pharmacist

## 2019-03-05 NOTE — Unmapped (Signed)
[facility] Specialty Medication Referral: PA Approved      Medication (Brand/Generic): BRAFTOVI AND MEKTOVI    Final Test Claim completed with resulted information below:    Patient ABLE to fill at [facility]  Insurance Company:  [facility] [employer]  Anticipated Copay: $61 EACH  Is anticipated copay with a copay card or grant? No, there is no need for grant or copay assistance.     Does this patient have to receive a partial fill of the medication due to insurance restrictions? NO  If so, please cofirm how many days supply is allowed per plan per fill and how long the patient will have to fill partial months supply for the medication: NOT APPLICABLE     If the copay is under the $25 defined limit, per policy there will be no further investigation of need for financial assistance at this time unless patient requests. This referral has been communicated to the provider and handed off to the [facility] team for further processing and filling of prescribed medication.   ______________________________________________________________________  Please utilize this referral for viewing purposes as it will serve as the central location for all relevant documentation and updates.

## 2019-03-08 NOTE — Unmapped (Signed)
Clinical Pharmacist Practitioner: [facility]    Patient Name: Doris Kramer  Patient Age: 53 y.o.  Encounter Date: 03/05/2019  Primary Oncologist: Tonia Ghent Moschos, MD    Reason for visit: oral targeted therapy counseling    Oral Chemotherapy Regimen: Braftovi 450 mg PO daily + Mektovi 45 mg PO BID  ? Braftovi and Mektovi will be filled by [facility] for $0 copay each.  ? Both medications may be taken with or without food. Instructed patient to avoid grapefruit and grapefruit juice.  ? Both medications should be stored at room temperature, ideally in original containers.  ? Common adverse events may include diarrhea, rash, fatigue, nausea, hair thinning/alopecia, myalgias.  ? Less common AE's may include decreased LVEF, ocular toxicity, secondary skin cancers, and hepatotoxicity.  ? Discussed management strategies for adverse events, such as antidiarrheals, analgesics, and antiemetics.  ? Monitoring parameters for toxicities were reviewed, including echocardiograms and routine skin exams.  ? Educated on oral chemotherapy handling precautions and importance of medication adherence.  ? Medication profile was reviewed with patient and no drug interactions were identified. Instructed patient to notify me of any new medications so I may evaluate for potential drug-drug interactions.    ASSESSMENT & PLAN:  1. Metastatic melanoma: previously on dabrafenib + trametinib, but stopped for intracranial and extracranial PD. Reports no significant toxicities with previous BRAF/MEK treatment. She met with Dr. Harold Hedge last week who recommended changing to encorafenib + binimetinib to help control extracranial disease while also being evaluated for RT vs surgical resection of brain met. Will plan to start targeted therapy ASAP but discussed need to hold treatment 2 days prior to either RT or surgery.  ?? Start Braftovi 450 mg qhs + Mektovi 45 mg BID tomorrow    2. Cardiotoxicity monitoring: last echo on 12/20/18 showed LVEF >55%. She has been off targeted therapy since July so no new baseline echo needed. Will continue monitoring q3-57m while on MEKi.  ?? Next echo due in December     3. Hypertension: clinic BP over past month has ranged from 160-182/78-96. On propanolol 60 mg qhs for migraines. Discussed that MEKi can increase BP and we may need to start antihypertensive. Will monitor BP at home.  ?? F/u on home BP after starting targeted therapy    F/U: appt with Dr. Harold Hedge on 10/9    ______________________________________________________________________    HPI: Doris Kramer is a 53 y.o. female with metastatic melanoma who will be starting treatment with Braftovi (encorafenib) and Mektovi (binimetinib).     ONCOLOGY HISTORY:  Oncology History Overview Note   ONCOLOGIC HISTORY   1. Stage IIC Peri-Anal --> Stage IV Melanoma  A. 03/2016, noted a hemorrhoid that continued to enlarge over a 6 month period  B. 10/05/16, underwent excision of the lesion by Dr. Toni Arthurs; path: Salvadore Dom 7 mm, ulceration positive, mitoses < 1 mm2 ([id number]); margins negative although deep margin was 1 mm; internal interpretation of primary lesion biopsy: Breslow depth = 7 mm, ulceration present, no LVI, 3 mitoses/mm^2, closest margin is 1 mm, arising from peri-anal skin  C. 10/19/16, PET CT: increased activity in left ovary with SUV 6.8 with low-density cyst measuring 12 mm; post-surgical changes at anus; no definitive evidence of metastatic disease  D. 10/24/16, transvaginal U/S: no left ovarian mass; mild thickening of the endometrium with 1 cm hypoechoic focus  E. 10/26/16, presents to the [facility]  F. 11/17/16, WLE and sentinel LN bx; path: 7 mm primary melanoma with evidence of  ulceration; 0/1 LN involved (subcapsular benign melanocytic nevus, scattered individual small clusters of histiocytes containing phagocytosed melanin granules and premelanosomes that are immunoreactive to HMB-45  G. 04/21/17, PET CT: indeterminate pulmonary nodules with a RLL nodule at 0.6 cm (previously 0.4 cm), LLL nodule at 0.4 cm (previously 0.3 cm); subcutaneous nodule overlying the left supraspinatus measuring 0.4 cm (previously 0.3 cm); new 2 cm enhancing nodule within the gallbladder (SUVmax 9.3); new left medial gluteal soft tissue nodule measuring 1.6 x 1.2 cm (SUVmax 8.6); slight increase in size of a subcentimeter soft tissue nodule overlying the left trapezius muscle   H. 05/01/17, excision biopsy of left medial gluteal soft tissue nodule performed by Dr. Dalbert Garnet; path: malignant melanoma with positive margins  I. 05/26/17, initiating pembrolizumab 200 mg IV every 3 weeks  J. 06/16/17, dose #2 pembrolizumab 200 mg IV  K. 07/07/17, dose #3 pembrolizumab 200 mg IV  L. 07/27/17, CT C/A/P: slight interval increase in size of the intra-luminal gall bladder mass (2.1 cm --> 2.4 cm); stable non-specific pulmonary nodules (<6 mm RLL nodule); slight interval increase in prominence of a left inguinal LN, now 1.3 cm; new left gluteal nodule measuring 1.1 cm (incorrectly read by radiologist since prior left gluteal lesion had been resected)  M. 07/28/17, dose #4 pembrolizumab 200 mg IV  N. 08/18/17, returns with increased fatigue, arthralgias, nausea; am cortisol found to be 0.6; hydrocortisone initiated; ipi/nivo held  O. 08/25/17, symptoms improved; left inguinal LN progression; ordered dabrafenib/trametinib therapy  P. 09/05/17, initiated dabrafenib 150 mg po bid + trametinib 2 mg po qday  Q. 09/08/17, left inguinal LN no longer palpable, other subcutaneous nodules smaller  R. 09/17/17, discontinued dabrafenib/trametinib due to worsening nausea and loose stools.  S. 09/20/17, returns to clinic with increasing fatigue, N/V, anorexia, and weight loss; epigastric tenderness noted on exam; admitted to 9300 for further w/u; CT A/P: evidence of pancolitis (diffuse colonic wall thickening most notable in the rectosigmoid colon), interval decrease in size of intraluminal gallbladder mass, interval decrease in size of subcutaneous left gluteal soft tissue nodule; unchanged endometrial lesion measuring up to 1.9 cm (fibroid vs polyp)  T. 09/21/17, sigmoidoscopy: Path - mucosal congestion/edema, erythema, and mucous, particularly in the rectum; biopsy showing evidence of both neutrophils and intra-epithelial lymphocyte infiltration c/w colitis secondary to CPI therapy  U. 09/23/17, discharged from 9300 on steroid taper  V. 10/06/17, returns for general f/u; steriod taper ongoing; abdominal discomfort, nausea, loose stools have all resolved  W. 11/02/17, reporting two day h/o low-grade fever, Tmax = 101.9  X. 11/03/17, TTE: normal EF, no abnormalities; afebrile; 11/09/17, re-starts dabrafenib 100 mg po bid + trametinib 2 mg po daily  Y. 12/01/17, returns reporting recurrent loose stools after prednisone taper  Z. 12/08/17, infliximab 5 mg/kg IV administered  AA. 12/17/17, diffuse rash and nausea reported; dabrafenib/trametinib held; loose stools improved  BB. 01/03/18, CT C/A/P: interval decrease in size and conspicuity of left gluteal soft tissue nodules (left gluteal nodule 0.9 x 0.4 cm --> 0.7 x 0.2 cm) and left inguinal LN (1.3 cm --> 0.8 cm); unchanged intraluminal gallbladder mass; unchanged small pulmonary nodules; resolved colitis; unchanged endometrial lesion - fibroid vs polyp; brain MRI: New 0.9 cm anterior left frontal lobe enhancing mass  CC. 01/17/18, underwent SRS therapy with Dr. Gwenevere Ghazi and Dr. Shaune Pollack  DD. 01/18/18, re-started dabrafenib 100 mg po bid + trametinib 1 mg po qday  EE. 03/09/18, TTE: EF >55%  FF. 04/20/18, CT C/A/P: interval decrease in size and conspicuity of  the only partially visualized left gluteal [place] soft tissue nodules; stable appearance of the minimally enlarged left inguinal LN (0.8 cm); stable to minimal increase in size of intraluminal gallbladder mass (2.1 x 1.5 cm --> 2.4 x 2.0 cm); unchanged scattered small pulmonary nodules; MRI brain: slight interval decrease in size of left anterior frontal lobe lesion, no new metastases; dab/tram discontinued  GG. 05/07/18, US-guided biopsy of gall bladder lesion: c/w metastatic melanoma  HH. 06/15/18, PET CT: Hypermetabolic mass in the gallbladder fundus has increased in size new hypermetabolic left inguinal and right iliac chain lymph nodes are concerning for new sites of metastasis.  II. 07/03/18,. Brain MRI: Stable left frontal lobe treated lesion with increased surrounding FLAIR signal - no new lesions; TTE: normal EF  JJ. 07/11/18, CT C/A/P: increase in size of left gluteal nodularity (0.5 cm --> 1.2 cm) and left inguinal LN (1.7 x 1.0 cm --> 2.5 x 1.7 cm); stable GB lesion, stable subcentimeter pulmonary nodules; no new metastatic disease  KK. 07/16/18, initiating treatment on the Nektar 17-262-01 phase 1/2 clinical trial; undergoing IR-guided injection of NKTR-262; treatment to left inguinal LN and gall bladder lesions; c/b persistent N/V, dehydration, confusion; improved with IVFs and anti-emetics - managed in Acute Care Clinic  LL. 08/06/18, cycle #2 on the Nektar 17-262-01 phase 1/2 clinical trial; undergoing IR-guided injection of NKTR-262; treatment to left inguinal LN and gall bladder lesions; also receiving NKTR-214, pegylated IL-2 infusion; supported by IV zofran, IVFs; doubled hydrocortisone dose  MM. 08/27/18, cycle #3 on the Nektar 17-262-01 phase 1/2 clinical trial; undergoing IR-guided injection of NKTR-262; treatment to left inguinal LN and gall bladder lesions; also receiving NKTR-214, pegylated IL-2 infusion; supported by IV zofran, IVFs; doubled hydrocortisone dose  NN. 09/12/18, CT C/A/P: Interval increase in size of a necrotic right iliac chain node measuring 2.1 cm, interval increase in size of a left inguinal LN measuring 3.7 x 3.7 cm, slight interval increase in size of left pelvic sidewall adenopathy measuring 1.0 cm, new left upper quadrant peritoneal soft tissue nodule; unchanged gallbladder lesion, unchanged subcentimeter pulmonary nodules; brain MRI: Interval increase in size of the left frontal metastasis with increased surrounding edema - possible posttreatment change  OO. 09/18/18, re-initiated dabrafenib 100 mg po bid + trametinib 2 mg po qday  PP. 09/21/18, reporting a decrease in size of peri-anal subcutaneous lesions; TTE: normal LV function  QQ. 10/19/18, EKG: NSR  RR. 10/20/18, increase dabrafenib to 150 mg po bid and will continued the trametinib at 2 mg po qday.   SS. 12/20/18, brain MRI: new right posterior medial temporal lobe metastasis, measuring approximately 2 mm; no significant change in size of the left anteromedial frontal lobe metastasis, measures approximately 1.2 cm x 0.7 cm, previously 1.0 x 0.7 cm; evaluated by Dr. Almetta Lovely in radonc, SRS recommended  TT. 12/28/18, PET CT: marked increased size and FDG avidity of right external iliac LNs and left inguinal LNs, new suspicious left external iliac LN; new soft tissue nodule along the right medial thigh; right breast nodule is increased in size and FDG avidity; new left upper lobe pulmonary, additional right lower lobe pulmonary nodule; soft tissue nodule posterior to the esophagus with moderate FDG avidity; decreased size and FDG avidity of gallbladder mass          MEDICATIONS:  Current Outpatient Medications   Medication Sig Dispense Refill   ??? acetaminophen (TYLENOL) 325 MG tablet Take 650 mg by mouth every four (4) hours as needed.     ???  binimetinib (MEKTOVI) 15 mg tablet Take 3 tablets (45 mg total) by mouth Two (2) times a day. 180 tablet 5   ??? encorafenib (BRAFTOVI) 75 mg capsule Take 6 capsules (450 mg total) by mouth daily. 180 capsule 5   ??? fluticasone propionate (FLONASE) 50 mcg/actuation nasal spray 1 spray by Each Nare route daily as needed.      ??? hydrocortisone (CORTEF) 10 MG tablet Take by mouth Two (2) times a day. 20 mg in AM, 10 mg in afternoon     ??? nicotine (NICODERM CQ) 21 mg/24 hr patch Place 1 patch on the skin daily.     ??? nicotine polacrilex (NICORETTE MINI) lozenge 4 MG ??? nicotine polacrilex (NICORETTE) 4 MG gum Start 4 weeks prior to Quit Day. Chew gum a few times then place between cheek and gum.Use every 15 minutes as needed for urges     ??? propranoloL (INDERAL) 60 MG tablet Take 60 mg by mouth nightly.        No current facility-administered medications for this visit.        VITAL SIGNS: There were no vitals taken for this visit.    LABORATORY DATA:  No visits with results within 1 Day(s) from this visit.   Latest known visit with results is:   Appointment on 02/22/2019   Component Date Value   ??? EKG Ventricular Rate 02/22/2019 67    ??? EKG Atrial Rate 02/22/2019 67    ??? EKG P-R Interval 02/22/2019 134    ??? EKG QRS Duration 02/22/2019 86    ??? EKG Q-T Interval 02/22/2019 404    ??? EKG QTC Calculation 02/22/2019 426    ??? EKG Calculated P Axis 02/22/2019 7    ??? EKG Calculated R Axis 02/22/2019 40    ??? EKG Calculated T Axis 02/22/2019 24    ??? QTC Fredericia 02/22/2019 419        ALLERGIES:  Allergies   Allergen Reactions   ??? Dilaudid [Hydromorphone (Bulk)] Nausea And Vomiting     Headache     ??? Morphine Nausea And Vomiting     headache   ??? Oxycodone Nausea And Vomiting     Headache         PERSONAL AND SOCIAL HISTORY:   Social History     Tobacco Use   ??? Smoking status: Current Some Day Smoker     Types: Cigarettes   Substance Use Topics   ??? Alcohol use: Yes     Frequency: Monthly or less   She reports no history of drug use.      Bobby Rumpf, PharmD, BCOP, CPP  Melanoma Clinical Pharmacist Practitioner  Pager: [phone]    I spent 30 minutes on the phone with the patient. I spent an additional 15 minutes on pre- and post-visit activities.     The patient was physically located in [place] or a state in which I am permitted to provide care. The patient and/or parent/guardian understood that s/he may incur co-pays and cost sharing, and agreed to the telemedicine visit. The visit was reasonable and appropriate under the circumstances given the patient's presentation at the time.    The patient and/or parent/guardian has been advised of the potential risks and limitations of this mode of treatment (including, but not limited to, the absence of in-person examination) and has agreed to be treated using telemedicine. The patient's/patient's family's questions regarding telemedicine have been answered.     If the visit was completed in an  ambulatory setting, the patient and/or parent/guardian has also been advised to contact their provider???s office for worsening conditions, and seek emergency medical treatment and/or call 911 if the patient deems either necessary.

## 2019-03-12 DIAGNOSIS — C799 Secondary malignant neoplasm of unspecified site: Secondary | ICD-10-CM

## 2019-03-12 MED ORDER — ENCORAFENIB 75 MG CAPSULE
ORAL_CAPSULE | Freq: Every day | ORAL | 5 refills | 30 days | Status: CP
Start: 2019-03-12 — End: 2019-04-01

## 2019-03-12 MED ORDER — BINIMETINIB 15 MG TABLET
ORAL_TABLET | Freq: Two times a day (BID) | ORAL | 5 refills | 30 days | Status: CP
Start: 2019-03-12 — End: 2019-04-01

## 2019-03-13 DIAGNOSIS — C711 Malignant neoplasm of frontal lobe: Secondary | ICD-10-CM | POA: Diagnosis not present

## 2019-03-13 DIAGNOSIS — C7931 Secondary malignant neoplasm of brain: Secondary | ICD-10-CM | POA: Diagnosis not present

## 2019-03-13 DIAGNOSIS — C799 Secondary malignant neoplasm of unspecified site: Secondary | ICD-10-CM | POA: Diagnosis not present

## 2019-03-14 NOTE — Unmapped (Signed)
This patient has been disenrolled from the [facility] specialty pharmacy services due to a pharmacy change. The patient is now filling at [facility] per insurance requirements.Doris Kramer  [facility] Specialty Pharmacist

## 2019-03-15 DIAGNOSIS — Z20828 Contact with and (suspected) exposure to other viral communicable diseases: Secondary | ICD-10-CM | POA: Diagnosis not present

## 2019-03-15 DIAGNOSIS — F17210 Nicotine dependence, cigarettes, uncomplicated: Secondary | ICD-10-CM | POA: Diagnosis not present

## 2019-03-15 DIAGNOSIS — C439 Malignant melanoma of skin, unspecified: Secondary | ICD-10-CM | POA: Diagnosis not present

## 2019-03-15 DIAGNOSIS — Z85820 Personal history of malignant melanoma of skin: Secondary | ICD-10-CM | POA: Diagnosis not present

## 2019-03-15 DIAGNOSIS — Z923 Personal history of irradiation: Secondary | ICD-10-CM | POA: Diagnosis not present

## 2019-03-15 DIAGNOSIS — C7931 Secondary malignant neoplasm of brain: Secondary | ICD-10-CM | POA: Diagnosis not present

## 2019-03-18 DIAGNOSIS — Z85820 Personal history of malignant melanoma of skin: Secondary | ICD-10-CM | POA: Diagnosis not present

## 2019-03-18 DIAGNOSIS — C7931 Secondary malignant neoplasm of brain: Secondary | ICD-10-CM | POA: Diagnosis not present

## 2019-03-19 DIAGNOSIS — Z9889 Other specified postprocedural states: Secondary | ICD-10-CM | POA: Diagnosis not present

## 2019-03-19 DIAGNOSIS — C4351 Malignant melanoma of anal skin: Secondary | ICD-10-CM | POA: Diagnosis not present

## 2019-03-21 ENCOUNTER — Institutional Professional Consult (permissible substitution): Admit: 2019-03-21 | Discharge: 2019-03-22 | Payer: PRIVATE HEALTH INSURANCE | Attending: Oncology | Primary: Oncology

## 2019-03-21 DIAGNOSIS — F17200 Nicotine dependence, unspecified, uncomplicated: Secondary | ICD-10-CM | POA: Diagnosis not present

## 2019-03-21 DIAGNOSIS — C799 Secondary malignant neoplasm of unspecified site: Secondary | ICD-10-CM

## 2019-03-28 DIAGNOSIS — C7931 Secondary malignant neoplasm of brain: Secondary | ICD-10-CM | POA: Diagnosis not present

## 2019-03-28 DIAGNOSIS — Z23 Encounter for immunization: Secondary | ICD-10-CM | POA: Diagnosis not present

## 2019-03-29 ENCOUNTER — Encounter
Admit: 2019-03-29 | Discharge: 2019-03-30 | Payer: PRIVATE HEALTH INSURANCE | Attending: Hematology & Oncology | Primary: Hematology & Oncology

## 2019-03-29 DIAGNOSIS — Z6831 Body mass index (BMI) 31.0-31.9, adult: Secondary | ICD-10-CM | POA: Diagnosis not present

## 2019-03-29 DIAGNOSIS — C7931 Secondary malignant neoplasm of brain: Secondary | ICD-10-CM | POA: Diagnosis not present

## 2019-03-29 DIAGNOSIS — Z9889 Other specified postprocedural states: Secondary | ICD-10-CM | POA: Diagnosis not present

## 2019-03-29 DIAGNOSIS — R1909 Other intra-abdominal and pelvic swelling, mass and lump: Secondary | ICD-10-CM | POA: Diagnosis not present

## 2019-03-29 DIAGNOSIS — C801 Malignant (primary) neoplasm, unspecified: Secondary | ICD-10-CM | POA: Diagnosis not present

## 2019-03-29 DIAGNOSIS — C799 Secondary malignant neoplasm of unspecified site: Secondary | ICD-10-CM | POA: Diagnosis not present

## 2019-04-01 MED ORDER — BINIMETINIB 15 MG TABLET: 30 mg | tablet | Freq: Two times a day (BID) | 5 refills | 30 days | Status: AC

## 2019-04-01 MED ORDER — ENCORAFENIB 75 MG CAPSULE: 300 mg | capsule | Freq: Every day | 5 refills | 30 days | Status: AC

## 2019-04-03 DIAGNOSIS — C799 Secondary malignant neoplasm of unspecified site: Principal | ICD-10-CM

## 2019-04-03 MED ORDER — BINIMETINIB 15 MG TABLET: 30 mg | tablet | Freq: Two times a day (BID) | 5 refills | 30 days | Status: AC

## 2019-04-03 MED ORDER — ENCORAFENIB 75 MG CAPSULE: 300 mg | capsule | Freq: Every day | 5 refills | 30 days | Status: AC

## 2019-04-04 DIAGNOSIS — C7931 Secondary malignant neoplasm of brain: Secondary | ICD-10-CM | POA: Diagnosis not present

## 2019-04-08 DIAGNOSIS — C7931 Secondary malignant neoplasm of brain: Secondary | ICD-10-CM | POA: Diagnosis not present

## 2019-04-16 DIAGNOSIS — C7931 Secondary malignant neoplasm of brain: Secondary | ICD-10-CM | POA: Diagnosis not present

## 2019-04-17 DIAGNOSIS — C7931 Secondary malignant neoplasm of brain: Secondary | ICD-10-CM | POA: Diagnosis not present

## 2019-04-18 DIAGNOSIS — C7931 Secondary malignant neoplasm of brain: Secondary | ICD-10-CM | POA: Diagnosis not present

## 2019-04-19 DIAGNOSIS — C7931 Secondary malignant neoplasm of brain: Secondary | ICD-10-CM | POA: Diagnosis not present

## 2019-04-22 DIAGNOSIS — C7931 Secondary malignant neoplasm of brain: Secondary | ICD-10-CM | POA: Diagnosis not present

## 2019-04-25 DIAGNOSIS — C4351 Malignant melanoma of anal skin: Secondary | ICD-10-CM | POA: Diagnosis not present

## 2019-04-25 DIAGNOSIS — C799 Secondary malignant neoplasm of unspecified site: Secondary | ICD-10-CM | POA: Diagnosis not present

## 2019-04-25 DIAGNOSIS — C7931 Secondary malignant neoplasm of brain: Secondary | ICD-10-CM | POA: Diagnosis not present

## 2019-04-25 DIAGNOSIS — F17200 Nicotine dependence, unspecified, uncomplicated: Secondary | ICD-10-CM | POA: Diagnosis not present

## 2019-04-26 ENCOUNTER — Encounter
Admit: 2019-04-26 | Discharge: 2019-04-26 | Payer: PRIVATE HEALTH INSURANCE | Attending: Hematology & Oncology | Primary: Hematology & Oncology

## 2019-04-26 DIAGNOSIS — Z923 Personal history of irradiation: Secondary | ICD-10-CM | POA: Diagnosis not present

## 2019-04-26 DIAGNOSIS — C7931 Secondary malignant neoplasm of brain: Secondary | ICD-10-CM | POA: Diagnosis not present

## 2019-04-26 DIAGNOSIS — Z79899 Other long term (current) drug therapy: Secondary | ICD-10-CM | POA: Diagnosis not present

## 2019-04-26 DIAGNOSIS — C799 Secondary malignant neoplasm of unspecified site: Secondary | ICD-10-CM | POA: Diagnosis not present

## 2019-04-26 DIAGNOSIS — Z6831 Body mass index (BMI) 31.0-31.9, adult: Secondary | ICD-10-CM | POA: Diagnosis not present

## 2019-04-26 DIAGNOSIS — R110 Nausea: Secondary | ICD-10-CM | POA: Diagnosis not present

## 2019-04-26 DIAGNOSIS — C439 Malignant melanoma of skin, unspecified: Secondary | ICD-10-CM | POA: Diagnosis not present

## 2019-04-26 DIAGNOSIS — Z5111 Encounter for antineoplastic chemotherapy: Principal | ICD-10-CM

## 2019-04-26 MED ORDER — TRAMADOL 50 MG TABLET
ORAL_TABLET | Freq: Four times a day (QID) | ORAL | 0 refills | 5.00000 days | Status: CP | PRN
Start: 2019-04-26 — End: 2020-04-25

## 2019-04-26 MED ORDER — BUDESONIDE DR - ER 3 MG CAPSULE,DELAYED,EXTENDED RELEASE
ORAL_CAPSULE | Freq: Every morning | ORAL | 2 refills | 30 days | Status: CP
Start: 2019-04-26 — End: 2020-04-25

## 2019-04-29 ENCOUNTER — Encounter: Admit: 2019-04-29 | Discharge: 2019-04-30 | Payer: PRIVATE HEALTH INSURANCE | Attending: Oncology | Primary: Oncology

## 2019-05-01 ENCOUNTER — Encounter: Admit: 2019-05-01 | Discharge: 2019-05-02 | Payer: PRIVATE HEALTH INSURANCE

## 2019-05-01 ENCOUNTER — Ambulatory Visit: Admit: 2019-05-01 | Discharge: 2019-05-02 | Payer: PRIVATE HEALTH INSURANCE

## 2019-05-01 DIAGNOSIS — Z5112 Encounter for antineoplastic immunotherapy: Secondary | ICD-10-CM | POA: Diagnosis not present

## 2019-05-01 DIAGNOSIS — C4351 Malignant melanoma of anal skin: Secondary | ICD-10-CM | POA: Diagnosis not present

## 2019-05-01 DIAGNOSIS — C7951 Secondary malignant neoplasm of bone: Secondary | ICD-10-CM | POA: Diagnosis not present

## 2019-05-01 DIAGNOSIS — C799 Secondary malignant neoplasm of unspecified site: Principal | ICD-10-CM

## 2019-05-14 ENCOUNTER — Encounter: Admit: 2019-05-14 | Discharge: 2019-05-14 | Payer: PRIVATE HEALTH INSURANCE

## 2019-05-14 ENCOUNTER — Encounter
Admit: 2019-05-14 | Discharge: 2019-05-14 | Payer: PRIVATE HEALTH INSURANCE | Attending: Hematology & Oncology | Primary: Hematology & Oncology

## 2019-05-14 DIAGNOSIS — R1013 Epigastric pain: Secondary | ICD-10-CM | POA: Diagnosis not present

## 2019-05-14 DIAGNOSIS — R5383 Other fatigue: Secondary | ICD-10-CM | POA: Diagnosis not present

## 2019-05-14 DIAGNOSIS — M549 Dorsalgia, unspecified: Secondary | ICD-10-CM | POA: Diagnosis not present

## 2019-05-14 DIAGNOSIS — R1900 Intra-abdominal and pelvic swelling, mass and lump, unspecified site: Secondary | ICD-10-CM | POA: Diagnosis not present

## 2019-05-14 DIAGNOSIS — Z9289 Personal history of other medical treatment: Secondary | ICD-10-CM | POA: Diagnosis not present

## 2019-05-14 DIAGNOSIS — G893 Neoplasm related pain (acute) (chronic): Secondary | ICD-10-CM | POA: Diagnosis not present

## 2019-05-14 DIAGNOSIS — C799 Secondary malignant neoplasm of unspecified site: Secondary | ICD-10-CM | POA: Diagnosis not present

## 2019-05-14 DIAGNOSIS — C439 Malignant melanoma of skin, unspecified: Secondary | ICD-10-CM | POA: Diagnosis not present

## 2019-05-14 DIAGNOSIS — R197 Diarrhea, unspecified: Secondary | ICD-10-CM | POA: Diagnosis not present

## 2019-05-14 DIAGNOSIS — Z5111 Encounter for antineoplastic chemotherapy: Principal | ICD-10-CM

## 2019-05-14 DIAGNOSIS — C7931 Secondary malignant neoplasm of brain: Principal | ICD-10-CM

## 2019-05-14 DIAGNOSIS — Z1329 Encounter for screening for other suspected endocrine disorder: Principal | ICD-10-CM

## 2019-05-22 ENCOUNTER — Encounter: Admit: 2019-05-22 | Discharge: 2019-05-23 | Payer: PRIVATE HEALTH INSURANCE | Attending: Oncology | Primary: Oncology

## 2019-05-22 DIAGNOSIS — L271 Localized skin eruption due to drugs and medicaments taken internally: Principal | ICD-10-CM

## 2019-05-22 DIAGNOSIS — R5383 Other fatigue: Principal | ICD-10-CM

## 2019-05-22 DIAGNOSIS — Z79899 Other long term (current) drug therapy: Principal | ICD-10-CM

## 2019-05-22 DIAGNOSIS — C799 Secondary malignant neoplasm of unspecified site: Principal | ICD-10-CM

## 2019-05-22 DIAGNOSIS — R197 Diarrhea, unspecified: Principal | ICD-10-CM

## 2019-05-24 DIAGNOSIS — F17200 Nicotine dependence, unspecified, uncomplicated: Secondary | ICD-10-CM | POA: Diagnosis not present

## 2019-05-29 ENCOUNTER — Encounter: Admit: 2019-05-29 | Discharge: 2019-05-30 | Payer: PRIVATE HEALTH INSURANCE

## 2019-05-29 DIAGNOSIS — C799 Secondary malignant neoplasm of unspecified site: Secondary | ICD-10-CM | POA: Diagnosis not present

## 2019-05-31 DIAGNOSIS — C7931 Secondary malignant neoplasm of brain: Principal | ICD-10-CM

## 2019-06-07 MED ORDER — PROPRANOLOL ER 60 MG CAPSULE,24 HR,EXTENDED RELEASE
ORAL_CAPSULE | Freq: Every day | ORAL | 3 refills | 30 days | Status: CP
Start: 2019-06-07 — End: 2020-06-01

## 2019-06-07 MED ORDER — PROPRANOLOL 60 MG TABLET
ORAL_TABLET | Freq: Every evening | ORAL | 1 refills | 30.00000 days | Status: CP
Start: 2019-06-07 — End: 2019-06-07

## 2019-06-10 DIAGNOSIS — C7931 Secondary malignant neoplasm of brain: Principal | ICD-10-CM

## 2019-06-11 DIAGNOSIS — C7931 Secondary malignant neoplasm of brain: Secondary | ICD-10-CM | POA: Diagnosis not present

## 2019-06-12 MED ORDER — LANCETS 28 GAUGE
0 refills | 0.00000 days | Status: CP
Start: 2019-06-12 — End: 2019-06-12

## 2019-06-12 MED ORDER — BLOOD-GLUCOSE METER KIT WRAPPER
0 refills | 0 days | Status: CP
Start: 2019-06-12 — End: 2020-06-11

## 2019-06-12 MED ORDER — LANCETS 28 GAUGE: each | 0 refills | 0 days | Status: AC

## 2019-06-12 MED ORDER — BLOOD SUGAR DIAGNOSTIC STRIPS
ORAL_STRIP | Freq: Once | 0 refills | 0 days | Status: CP
Start: 2019-06-12 — End: 2019-06-12

## 2019-06-18 ENCOUNTER — Other Ambulatory Visit: Admit: 2019-06-18 | Discharge: 2019-06-18 | Payer: PRIVATE HEALTH INSURANCE

## 2019-06-18 ENCOUNTER — Encounter
Admit: 2019-06-18 | Discharge: 2019-06-18 | Payer: PRIVATE HEALTH INSURANCE | Attending: Hematology & Oncology | Primary: Hematology & Oncology

## 2019-06-18 ENCOUNTER — Encounter: Admit: 2019-06-18 | Discharge: 2019-06-18 | Payer: PRIVATE HEALTH INSURANCE

## 2019-06-18 DIAGNOSIS — C4359 Malignant melanoma of other part of trunk: Secondary | ICD-10-CM | POA: Diagnosis not present

## 2019-06-18 DIAGNOSIS — C792 Secondary malignant neoplasm of skin: Secondary | ICD-10-CM | POA: Diagnosis not present

## 2019-06-18 DIAGNOSIS — R918 Other nonspecific abnormal finding of lung field: Secondary | ICD-10-CM | POA: Diagnosis not present

## 2019-06-18 DIAGNOSIS — C439 Malignant melanoma of skin, unspecified: Secondary | ICD-10-CM | POA: Diagnosis not present

## 2019-06-18 DIAGNOSIS — C7931 Secondary malignant neoplasm of brain: Principal | ICD-10-CM

## 2019-06-19 DIAGNOSIS — C439 Malignant melanoma of skin, unspecified: Principal | ICD-10-CM

## 2019-06-19 MED ORDER — HYDROCORTISONE 10 MG TABLET
ORAL_TABLET | 1 refills | 0 days | Status: CP
Start: 2019-06-19 — End: ?

## 2019-06-25 ENCOUNTER — Ambulatory Visit
Admit: 2019-06-25 | Discharge: 2019-07-21 | Payer: PRIVATE HEALTH INSURANCE | Attending: Radiation Oncology | Primary: Radiation Oncology

## 2019-06-25 ENCOUNTER — Encounter: Admit: 2019-06-25 | Discharge: 2019-07-21 | Payer: PRIVATE HEALTH INSURANCE

## 2019-06-26 ENCOUNTER — Encounter: Admit: 2019-06-26 | Discharge: 2019-06-27 | Payer: PRIVATE HEALTH INSURANCE

## 2019-06-26 DIAGNOSIS — C799 Secondary malignant neoplasm of unspecified site: Principal | ICD-10-CM

## 2019-07-02 MED ORDER — HYDROCORTISONE 20 MG TABLET
ORAL_TABLET | 1 refills | 0 days | Status: CP
Start: 2019-07-02 — End: ?

## 2019-07-22 ENCOUNTER — Encounter: Admit: 2019-07-22 | Discharge: 2019-08-18 | Payer: PRIVATE HEALTH INSURANCE

## 2019-07-22 DIAGNOSIS — C799 Secondary malignant neoplasm of unspecified site: Principal | ICD-10-CM

## 2019-07-22 DIAGNOSIS — I10 Essential (primary) hypertension: Principal | ICD-10-CM

## 2019-08-09 ENCOUNTER — Encounter
Admit: 2019-08-09 | Discharge: 2019-08-10 | Payer: PRIVATE HEALTH INSURANCE | Attending: Hematology & Oncology | Primary: Hematology & Oncology

## 2019-08-12 DIAGNOSIS — C439 Malignant melanoma of skin, unspecified: Principal | ICD-10-CM

## 2019-08-14 DIAGNOSIS — C439 Malignant melanoma of skin, unspecified: Principal | ICD-10-CM

## 2019-08-15 DIAGNOSIS — C799 Secondary malignant neoplasm of unspecified site: Principal | ICD-10-CM

## 2019-08-15 MED ORDER — SILVER SULFADIAZINE 1 % TOPICAL CREAM
1 refills | 0 days | Status: CP
Start: 2019-08-15 — End: 2020-08-14

## 2019-08-19 ENCOUNTER — Encounter: Admit: 2019-08-19 | Discharge: 2019-09-18 | Payer: PRIVATE HEALTH INSURANCE

## 2019-08-19 ENCOUNTER — Non-Acute Institutional Stay: Admit: 2019-08-19 | Discharge: 2019-09-18 | Payer: PRIVATE HEALTH INSURANCE

## 2019-08-19 DIAGNOSIS — C799 Secondary malignant neoplasm of unspecified site: Principal | ICD-10-CM

## 2019-08-22 ENCOUNTER — Encounter: Admit: 2019-08-22 | Discharge: 2019-09-20 | Payer: PRIVATE HEALTH INSURANCE

## 2019-08-22 ENCOUNTER — Encounter: Admit: 2019-08-22 | Discharge: 2019-08-23 | Payer: PRIVATE HEALTH INSURANCE

## 2019-08-22 ENCOUNTER — Ambulatory Visit: Admit: 2019-08-22 | Discharge: 2019-08-23 | Payer: PRIVATE HEALTH INSURANCE

## 2019-08-22 ENCOUNTER — Encounter
Admit: 2019-08-22 | Discharge: 2019-08-23 | Payer: PRIVATE HEALTH INSURANCE | Attending: Hematology & Oncology | Primary: Hematology & Oncology

## 2019-08-22 DIAGNOSIS — C439 Malignant melanoma of skin, unspecified: Principal | ICD-10-CM

## 2019-08-27 DIAGNOSIS — C439 Malignant melanoma of skin, unspecified: Principal | ICD-10-CM

## 2019-08-27 DIAGNOSIS — C799 Secondary malignant neoplasm of unspecified site: Principal | ICD-10-CM

## 2019-08-27 MED ORDER — LENVIMA 20 MG/DAY (10 MG X 2) CAPSULE
ORAL_CAPSULE | Freq: Every day | ORAL | 5 refills | 0 days | Status: CP
Start: 2019-08-27 — End: ?
  Filled 2019-09-11: qty 60, 30d supply, fill #0

## 2019-08-28 DIAGNOSIS — C799 Secondary malignant neoplasm of unspecified site: Principal | ICD-10-CM

## 2019-08-29 ENCOUNTER — Institutional Professional Consult (permissible substitution): Admit: 2019-08-29 | Discharge: 2019-08-30 | Payer: PRIVATE HEALTH INSURANCE | Attending: Oncology | Primary: Oncology

## 2019-08-29 MED ORDER — TRAMADOL 50 MG TABLET
ORAL_TABLET | Freq: Four times a day (QID) | ORAL | 0 refills | 8.00000 days | Status: CP | PRN
Start: 2019-08-29 — End: 2020-08-28

## 2019-09-09 NOTE — Unmapped (Signed)
[facility] Specialty Medication Onboarding    Specialty Medication: LENVIMA  Prior Authorization: Approved   Financial Assistance: No - copay  <$25  Final Copay/Day Supply: $0 / 30 DAYS    Insurance Restrictions: Yes - max 1 month supply     Notes to Pharmacist:     The triage team has completed the benefits investigation and has determined that the patient is able to fill this medication at [facility]. Please contact the patient to complete the onboarding or follow up with the prescribing physician as needed.

## 2019-09-10 MED ORDER — ONDANSETRON 8 MG DISINTEGRATING TABLET
ORAL_TABLET | Freq: Three times a day (TID) | ORAL | 1 refills | 10 days | Status: CP | PRN
Start: 2019-09-10 — End: ?

## 2019-09-10 NOTE — Unmapped (Signed)
Aurther Loft with [facility] contacted the [employer] requesting to speak with the care team of Sharlett Blaze Sandin to discuss:    Patient's new preferred pharmacy is [facility] due to a change in insurance. They need information about mektovi and braftovi    Please contact the pharmacy at [phone]. [phone]    Thank you,   Kelli Hope  [facility]   [phone]

## 2019-09-10 NOTE — Unmapped (Signed)
[facility]   Patient Onboarding/Medication Counseling    Doris Kramer is a 54 y.o. female with melanoma who I am counseling today on initiation of therapy.  I am speaking to the patient.    Was a Nurse, learning disability used for this call? No    Verified patient's date of birth / HIPAA.    Specialty medication(s) to be sent: Hematology/Oncology: Assunta Curtis      Non-specialty medications/supplies to be sent: n/a      Medications not needed at this time: n/a       The patient declined counseling on medication administration, missed dose instructions, goals of therapy, side effects and monitoring parameters, warnings and precautions, drug/food interactions and storage, handling precautions, and disposal because they were counseled in clinic. The information in the declined sections below are for informational purposes only and was not discussed with patient.       Lenvima (Lenvatinib)    Medication & Administration     Dosage:  2-10 mg capsules by mouth once daily   Comes in blister card packaging.    Administration:   ? Comes in a blister card packing. It is important to take exact dose as prescribed.    ? Administer orally at the same time each day.   ? May be administered with or without food.  ? Capsules may be swallowed whole or dissolved in a small glass of liquid. To dissolve in liquid, measure 15 mL of water or apple juice into a glass; add whole capsule (do not break or crush capsule) and leave in liquid for at least 10 minutes, then stir for at least 3 minutes. Administer liquid, then add 15 mL of additional water or apple juice to glass, swirl a few times and then swallow additional liquid.    Adherence/Missed dose instructions: If you miss a dose, take it as soon as you remember.  If the next dose is scheduled within 12 hours, skip the missed dose and take the next dose at its next regularly scheduled time. Do not take an extra dose to make up for a missed dose.    Goals of Therapy     Prevent disease progression    Side Effects & Monitoring Parameters     ? Nausea/Vomiting  ? Diarrhea  ? Fatigue  ? Cough   ? High Blood Pressure  ? Hand Foot Syndrome  ? Increase bleeding risk  ? Decreased appetite and changes in taste  ? Dry mouth, mouth irritation, mouth pain, mouth sores  ? Joint/muscle aches  ? Restlessness, difficulty sleeping  ? Impaired wound healing  ? Heartburn  ? Hair loss   ? Headache  ? Protein in the urine  ? Voice changes     The following side effects should be reported to the provider:  ? Heartbeat that doesn't feel normal (heart feels like it's racing, skipping a beat or fluttering)  ? Hypertension  ? Dark urine or yellow skin or eyes  ? Signs of infection (fever, chills, cough)  ? Excessive bruising, gums bleeding or nose bleeds  ? Mouth irritation or mouth sores  ? Redness or irritation on the palms of the hands or soles of the feet  ? Swelling, warmth, numbness, change in color or pain in a leg or arm  ? Signs of low thyroid (constipation, trouble handling heat or cold, memory problems, mood changes, or burning, numbness or tingling feeling).  ? Severe or persistent diarrhea  ? Signs of posterior reversible encephalopathy  syndrome (confusion, not alert, vision changes, seizures, or severe headache)  ? Signs of cerebrovascular disease (change in strength on one side is greater than the other, trouble speaking or thinking, change in balance or vision changes)  ? Signs of fluid and electrolyte problems (mood changes, confusion, muscle pain or weakness, abnormal/fast heartbeat, severe dizziness, passing out)  ? Signs of kidney problems (inability to pass urine, blood in the urine, change in amount of urine passed, weight gain)  ? Signs of UTI (blood in the urine, burning or painful urination, passing a lot of urine, fever, lower abdominal or pelvic pain)  ? Signs of anaphylaxis (wheezing, chest tightness, swelling of face, lips, tongue or throat)    Monitoring Parameters:  ? Monitor for Qtc prolongation  ? Blood pressure  ? Liver function tests at baseline and during treatment  ? Renal function  ? Electrolytes  ? Serum calcium  ? TSH  ? Thyroid function at baseline and monthly  ? Proteinuria baseline and during treatment  ? Pregnancy test in females of reproductive potential      Contraindications, Warnings, & Precautions     ? Hand-foot syndrome- counseled patient to use moisturizer on hands/feet, use lukewarm water especially when washing hands and pat them dry instead of rubbing them   ? Mouth sores- discussed use of baking soda/salt water rinses   ? Cardiac effects : hypertension  ? GI perforation  ? Hemorrhage  ? Hepatotoxicity  ? Hypocalcemia  ? Hypothyroidism  ? Reversible posterior leukocencephalopathy syndrome  ? Thromboembolic events  ? Wound healing impairment  ? Exposure of an unborn child to this medication could cause birth defects, so you should not become pregnant or father a child while on this medication.  Effective birth control is necessary during treatment and for at least 30 days after treatment.    Drug/Food Interactions     ? Medication list reviewed in Epic. The patient was instructed to inform the care team before taking any new medications or supplements including over the counter medications, vitamins, and herbal supplements. No drug interactions identified.     Storage, Handling Precautions, & Disposal     ? This medication should be stored at room temperature and in a dry location. Keep out of reach of others including children and pets. Keep the medicine in the original blister packs (no pillboxes).   ? Do not throw away or flush unused mediation down the toilet or sink. This drug is considered hazardous and should be handled as little as possible.  If someone else helps with medication administration, they should wear gloves      Current Medications (including OTC/herbals), Comorbidities and Allergies     Current Outpatient Medications   Medication Sig Dispense Refill ??? blood-glucose meter kit Use as instructed 1 each 0   ??? fluticasone propionate (FLONASE) 50 mcg/actuation nasal spray 1 spray by Each Nare route daily as needed.      ??? hydrocortisone (CORTEF) 20 MG tablet Please take 40mg  in the AM, 30mg  at 4PM and 10mg  at 7PM 120 tablet 1   ??? lancets (FREESTYLE) 28 gauge Misc Test once daily and as needed if you are having symptoms of  hypoglycemia. 100 each 0   ??? lenvatinib (LENVIMA) 20 mg/day (10 mg x 2) cap Take 2 capsules (20 mg) by mouth daily. 60 capsule 5   ??? propranoloL (INDERAL LA) 60 mg 24 hr capsule Take 1 capsule (60 mg total) by mouth daily. 30 capsule 3   ???  rosuvastatin (CRESTOR) 40 MG tablet      ??? silver sulfaDIAZINE (SILVADENE, SSD) 1 % cream Apply to affected area daily 50 g 1   ??? traMADoL (ULTRAM) 50 mg tablet Take 1 tablet (50 mg total) by mouth every six (6) hours as needed for pain. 30 tablet 0     No current facility-administered medications for this visit.        Allergies   Allergen Reactions   ??? Dilaudid [Hydromorphone (Bulk)] Nausea And Vomiting     Headache     ??? Morphine Nausea And Vomiting     headache   ??? Oxycodone Nausea And Vomiting     Headache         Patient Active Problem List   Diagnosis   ??? Metastatic melanoma (CMS-HCC)   ??? Tobacco use   ??? Hypertension       Reviewed and up to date in Epic.    Appropriateness of Therapy     Is medication and dose appropriate based on diagnosis? Yes    Prescription has been clinically reviewed: Yes    Baseline Quality of Life Assessment      How many days over the past month did your melanoma  keep you from your normal activities? For example, brushing your teeth or getting up in the morning. 0    Financial Information     Medication Assistance provided: Prior Authorization    Anticipated copay of $0 for max 30 days reviewed with patient. Verified delivery address.    Delivery Information     Scheduled delivery date: 09/12/19    Expected start date: 09/12/19    Medication will be delivered via UPS to the prescription address in [facility].  This shipment will not require a signature.      Explained the services we provide at [facility] and that each month we would call to set up refills.  Stressed importance of returning phone calls so that we could ensure they receive their medications in time each month.  Informed patient that we should be setting up refills 7-10 days prior to when they will run out of medication.  A pharmacist will reach out to perform a clinical assessment periodically.  Informed patient that a welcome packet and a drug information handout will be sent.      Patient verbalized understanding of the above information as well as how to contact the pharmacy at [phone] option 4 with any questions/concerns.  The pharmacy is open Monday through Friday 8:30am-4:30pm.  A pharmacist is available 24/7 via pager to answer any clinical questions they may have.    Patient Specific Needs     ? Does the patient have any physical, cognitive, or cultural barriers? No    ? Patient prefers to have medications discussed with  Patient     ? Is the patient or caregiver able to read and understand education materials at a high school level or above? Yes    ? Patient's primary language is  English     ? Is the patient high risk? Yes, patient is taking oral chemotherapy. Appropriateness of therapy has been assessed.     ? Does the patient require a Care Management Plan? No     ? Does the patient require physician intervention or other additional services (i.e. nutrition, smoking cessation, social work)? No      Soniya Ashraf Vangie Bicker  [facility] Specialty Pharmacist

## 2019-09-10 NOTE — Unmapped (Signed)
Called Mrs. Libman to inform her that Assunta Curtis was approved for $0 copay. Her insurance will allow her to get 1st fill through [facility], but future refills will need to go through [facility]. [facility] Pharmacist will call her to set-up medication delivery, which will likely be this Thursday 3/25. Informed Mrs. Leclere that she can start Lenvima on Thursday or Friday, and we will see her in clinic on Friday to start pembrolizumab.    Provided refill for Zofran 8 mg ODT to use PRN N/V which have worked well for her in the past.    Bobby Rumpf, PharmD, BCOP, CPP  Melanoma Clinical Pharmacist Practitioner  Pager: [phone]

## 2019-09-11 MED FILL — LENVIMA 20 MG/DAY (10 MG X 2) CAPSULE: 30 days supply | Qty: 60 | Fill #0 | Status: AC

## 2019-09-11 NOTE — Unmapped (Signed)
I spoke with patient Doris Kramer to confirm appointments on the following date  3/26 to start in the am with all three appointments called two times.     Tacey Heap

## 2019-09-13 ENCOUNTER — Encounter: Admit: 2019-09-13 | Discharge: 2019-09-14 | Payer: PRIVATE HEALTH INSURANCE

## 2019-09-13 ENCOUNTER — Ambulatory Visit
Admit: 2019-09-13 | Discharge: 2019-09-14 | Payer: PRIVATE HEALTH INSURANCE | Attending: Hematology & Oncology | Primary: Hematology & Oncology

## 2019-09-13 ENCOUNTER — Encounter
Admit: 2019-09-13 | Discharge: 2019-09-14 | Payer: PRIVATE HEALTH INSURANCE | Attending: Hematology & Oncology | Primary: Hematology & Oncology

## 2019-09-13 DIAGNOSIS — C799 Secondary malignant neoplasm of unspecified site: Principal | ICD-10-CM

## 2019-09-13 LAB — THYROID STIMULATING HORMONE: Thyrotropin:ACnc:Pt:Ser/Plas:Qn:: 1.31

## 2019-09-13 LAB — COMPREHENSIVE METABOLIC PANEL
ALBUMIN: 4.2 g/dL (ref 3.5–5.0)
ALKALINE PHOSPHATASE: 89 U/L (ref 38–126)
ALT (SGPT): 8 U/L (ref <35)
AST (SGOT): 17 U/L (ref 14–38)
BILIRUBIN TOTAL: 0.5 mg/dL (ref 0.0–1.2)
BLOOD UREA NITROGEN: 11 mg/dL (ref 7–21)
BUN / CREAT RATIO: 17
CALCIUM: 9.4 mg/dL (ref 8.5–10.2)
CHLORIDE: 105 mmol/L (ref 98–107)
CO2: 29 mmol/L (ref 22.0–30.0)
CREATININE: 0.63 mg/dL (ref 0.60–1.00)
EGFR CKD-EPI AA FEMALE: >90 mL/min/{1.73_m2} (ref >=60)
EGFR CKD-EPI NON-AA FEMALE: >90 mL/min/{1.73_m2} (ref >=60)
GLUCOSE RANDOM: 104 mg/dL (ref 70–179)
POTASSIUM: 4 mmol/L (ref 3.5–5.0)
PROTEIN TOTAL: 6.9 g/dL (ref 6.5–8.3)
SODIUM: 140 mmol/L (ref 135–145)

## 2019-09-13 LAB — CBC W/ AUTO DIFF
BASOPHILS ABSOLUTE COUNT: 0 10*9/L (ref 0.0–0.1)
BASOPHILS RELATIVE PERCENT: 0.3 %
EOSINOPHILS ABSOLUTE COUNT: 0.1 10*9/L (ref 0.0–0.4)
EOSINOPHILS RELATIVE PERCENT: 1.1 %
HEMATOCRIT: 37.7 % (ref 36.0–46.0)
LYMPHOCYTES ABSOLUTE COUNT: 0.9 10*9/L — ABNORMAL LOW (ref 1.5–5.0)
LYMPHOCYTES RELATIVE PERCENT: 10.3 %
MEAN CORPUSCULAR HEMOGLOBIN CONC: 31.8 g/dL (ref 31.0–37.0)
MEAN CORPUSCULAR HEMOGLOBIN: 28.3 pg (ref 26.0–34.0)
MEAN PLATELET VOLUME: 7.1 fL (ref 7.0–10.0)
MONOCYTES ABSOLUTE COUNT: 0.5 10*9/L (ref 0.2–0.8)
MONOCYTES RELATIVE PERCENT: 6 %
NEUTROPHILS ABSOLUTE COUNT: 7.1 10*9/L (ref 2.0–7.5)
NEUTROPHILS RELATIVE PERCENT: 81.2 %
PLATELET COUNT: 369 10*9/L (ref 150–440)
RED BLOOD CELL COUNT: 4.23 10*12/L (ref 4.00–5.20)
RED CELL DISTRIBUTION WIDTH: 15.8 % — ABNORMAL HIGH (ref 12.0–15.0)
WBC ADJUSTED: 8.8 10*9/L (ref 4.5–11.0)

## 2019-09-13 LAB — BILIRUBIN TOTAL: Bilirubin:MCnc:Pt:Ser/Plas:Qn:: 0.5

## 2019-09-13 LAB — HEMOGLOBIN: Hemoglobin:MCnc:Pt:Bld:Qn:: 12

## 2019-09-13 LAB — FREE T4: Thyroxine.free:MCnc:Pt:Ser/Plas:Qn:: 1.49 — ABNORMAL HIGH

## 2019-09-13 MED ORDER — LISINOPRIL 20 MG TABLET
ORAL_TABLET | Freq: Every day | ORAL | 11 refills | 30.00000 days | Status: CP
Start: 2019-09-13 — End: 2020-09-12

## 2019-09-13 MED ORDER — PROPRANOLOL ER 60 MG CAPSULE,24 HR,EXTENDED RELEASE
ORAL_CAPSULE | ORAL | 0 refills | 14.00000 days | Status: CP
Start: 2019-09-13 — End: 2019-09-27

## 2019-09-13 NOTE — Unmapped (Signed)
PIV placed (#22 RAC).  Labs collected & sent for analysis. To next appt.  Care provided by B. Crutchfield RN.

## 2019-09-13 NOTE — Unmapped (Signed)
Lab on 09/13/2019   Component Date Value Ref Range Status   ??? Sodium 09/13/2019 140  135 - 145 mmol/L Final   ??? Potassium 09/13/2019 4.0  3.5 - 5.0 mmol/L Final   ??? Chloride 09/13/2019 105  98 - 107 mmol/L Final   ??? Anion Gap 09/13/2019 6* 7 - 15 mmol/L Final   ??? CO2 09/13/2019 29.0  22.0 - 30.0 mmol/L Final   ??? BUN 09/13/2019 11  7 - 21 mg/dL Final   ??? Creatinine 09/13/2019 0.63  0.60 - 1.00 mg/dL Final   ??? BUN/Creatinine Ratio 09/13/2019 17   Final   ??? EGFR CKD-EPI Non-African American,* 09/13/2019 >90  >=60 mL/min/1.75m2 Final   ??? EGFR CKD-EPI African American, Fem* 09/13/2019 >90  >=60 mL/min/1.62m2 Final   ??? Glucose 09/13/2019 104  70 - 179 mg/dL Final   ??? Calcium 96/29/5284 9.4  8.5 - 10.2 mg/dL Final   ??? Albumin 13/24/4010 4.2  3.5 - 5.0 g/dL Final   ??? Total Protein 09/13/2019 6.9  6.5 - 8.3 g/dL Final   ??? Total Bilirubin 09/13/2019 0.5  0.0 - 1.2 mg/dL Final   ??? AST 27/25/3664 17  14 - 38 U/L Final   ??? ALT 09/13/2019 8  <35 U/L Final   ??? Alkaline Phosphatase 09/13/2019 89  38 - 126 U/L Final   ??? TSH 09/13/2019 1.310  0.600 - 3.300 uIU/mL Final   ??? Free T4 09/13/2019 1.49* 0.71 - 1.40 ng/dL Final   ??? WBC 40/34/7425 8.8  4.5 - 11.0 10*9/L Final   ??? RBC 09/13/2019 4.23  4.00 - 5.20 10*12/L Final   ??? HGB 09/13/2019 12.0  12.0 - 16.0 g/dL Final   ??? HCT 95/63/8756 37.7  36.0 - 46.0 % Final   ??? MCV 09/13/2019 89.0  80.0 - 100.0 fL Final   ??? MCH 09/13/2019 28.3  26.0 - 34.0 pg Final   ??? MCHC 09/13/2019 31.8  31.0 - 37.0 g/dL Final   ??? RDW 43/32/9518 15.8* 12.0 - 15.0 % Final   ??? MPV 09/13/2019 7.1  7.0 - 10.0 fL Final   ??? Platelet 09/13/2019 369  150 - 440 10*9/L Final   ??? Neutrophils % 09/13/2019 81.2  % Final   ??? Lymphocytes % 09/13/2019 10.3  % Final   ??? Monocytes % 09/13/2019 6.0  % Final   ??? Eosinophils % 09/13/2019 1.1  % Final   ??? Basophils % 09/13/2019 0.3  % Final   ??? Absolute Neutrophils 09/13/2019 7.1  2.0 - 7.5 10*9/L Final   ??? Absolute Lymphocytes 09/13/2019 0.9* 1.5 - 5.0 10*9/L Final   ??? Absolute Monocytes 09/13/2019 0.5  0.2 - 0.8 10*9/L Final   ??? Absolute Eosinophils 09/13/2019 0.1  0.0 - 0.4 10*9/L Final   ??? Absolute Basophils 09/13/2019 0.0  0.0 - 0.1 10*9/L Final   ??? Large Unstained Cells 09/13/2019 1  0 - 4 % Final   ??? Hypochromasia 09/13/2019 Moderate* Not Present Final

## 2019-09-13 NOTE — Unmapped (Signed)
Labs found to be within parameters for treatment today. Request for drug sent to pharmacy.

## 2019-09-14 NOTE — Unmapped (Signed)
Labs within treatment parameters, Patient receiving pembrolizumab at reduced dose. She has received long time ago and is aware of side effects. BP repeated due to elevation with several readings today. Improved value noted. PIV already in place from earlier lab appointment. Blood return noted prior to starting infusion. Treatment tolerated without any difficulty. PIV removed prior to discharge from infusion. No redness, tenderness or swelling noted. AVS printed and given to patient prior to leaving infusion clinic. She was discharged ambulatory with steady gait, NAD and not requiring any additional nursing assistance.

## 2019-09-16 DIAGNOSIS — C439 Malignant melanoma of skin, unspecified: Principal | ICD-10-CM

## 2019-09-16 MED ORDER — LENVIMA 20 MG/DAY (10 MG X 2) CAPSULE
ORAL_CAPSULE | Freq: Every day | ORAL | 5 refills | 30.00000 days | Status: CP
Start: 2019-09-16 — End: ?

## 2019-09-16 NOTE — Unmapped (Signed)
This patient has been disenrolled from the [facility] specialty pharmacy services due to a pharmacy change. The patient is now filling at [facility] due to insurance requirements.Nanetta Batty  [facility] Specialty Pharmacist

## 2019-09-19 ENCOUNTER — Encounter: Admit: 2019-09-19 | Discharge: 2019-09-20 | Payer: PRIVATE HEALTH INSURANCE | Attending: Oncology | Primary: Oncology

## 2019-09-20 MED ORDER — HYDROCORTISONE 20 MG TABLET
ORAL_TABLET | 3 refills | 0 days | Status: CP
Start: 2019-09-20 — End: ?

## 2019-09-24 ENCOUNTER — Encounter
Admit: 2019-09-24 | Discharge: 2019-09-25 | Payer: PRIVATE HEALTH INSURANCE | Attending: Hematology & Oncology | Primary: Hematology & Oncology

## 2019-09-24 DIAGNOSIS — C439 Malignant melanoma of skin, unspecified: Principal | ICD-10-CM

## 2019-09-24 MED ORDER — AMLODIPINE 10 MG TABLET
ORAL_TABLET | Freq: Every day | ORAL | 3 refills | 90.00000 days | Status: CP
Start: 2019-09-24 — End: 2020-09-23

## 2019-09-24 MED ORDER — LISINOPRIL 40 MG TABLET
ORAL_TABLET | Freq: Every day | ORAL | 11 refills | 30 days | Status: CP
Start: 2019-09-24 — End: 2020-09-23

## 2019-09-30 MED ORDER — HYDROCHLOROTHIAZIDE 25 MG TABLET
ORAL_TABLET | Freq: Every day | ORAL | 11 refills | 30 days | Status: CP
Start: 2019-09-30 — End: 2020-09-29

## 2019-10-04 ENCOUNTER — Encounter: Admit: 2019-10-04 | Discharge: 2019-10-05 | Payer: PRIVATE HEALTH INSURANCE

## 2019-10-04 DIAGNOSIS — C799 Secondary malignant neoplasm of unspecified site: Principal | ICD-10-CM

## 2019-10-07 DIAGNOSIS — J329 Chronic sinusitis, unspecified: Principal | ICD-10-CM

## 2019-10-07 MED ORDER — AMOXICILLIN 500 MG CAPSULE
ORAL_CAPSULE | Freq: Three times a day (TID) | ORAL | 0 refills | 7.00000 days | Status: CP
Start: 2019-10-07 — End: 2019-10-14

## 2019-10-08 MED ORDER — LIDOCAINE-DIPHENHYD-AL-MAG-SIM 200 MG-25 MG-400 MG-40MG/30ML MOUTHWASH
Freq: Four times a day (QID) | OROMUCOSAL | 2 refills | 6 days | Status: CP | PRN
Start: 2019-10-08 — End: ?

## 2019-10-10 ENCOUNTER — Encounter: Admit: 2019-10-10 | Discharge: 2019-10-11 | Payer: PRIVATE HEALTH INSURANCE | Attending: Oncology | Primary: Oncology

## 2019-10-10 DIAGNOSIS — I10 Essential (primary) hypertension: Principal | ICD-10-CM

## 2019-10-10 DIAGNOSIS — C799 Secondary malignant neoplasm of unspecified site: Principal | ICD-10-CM

## 2019-10-25 ENCOUNTER — Encounter
Admit: 2019-10-25 | Discharge: 2019-10-25 | Payer: PRIVATE HEALTH INSURANCE | Attending: Hematology & Oncology | Primary: Hematology & Oncology

## 2019-10-25 ENCOUNTER — Ambulatory Visit: Admit: 2019-10-25 | Discharge: 2019-10-25 | Payer: PRIVATE HEALTH INSURANCE

## 2019-10-25 ENCOUNTER — Other Ambulatory Visit: Admit: 2019-10-25 | Discharge: 2019-10-25 | Payer: PRIVATE HEALTH INSURANCE

## 2019-10-25 ENCOUNTER — Encounter: Admit: 2019-10-25 | Discharge: 2019-10-25 | Payer: PRIVATE HEALTH INSURANCE

## 2019-10-25 ENCOUNTER — Ambulatory Visit: Admit: 2019-10-25 | Discharge: 2019-10-25 | Payer: PRIVATE HEALTH INSURANCE | Attending: Oncology | Primary: Oncology

## 2019-10-25 DIAGNOSIS — C799 Secondary malignant neoplasm of unspecified site: Principal | ICD-10-CM

## 2019-10-25 DIAGNOSIS — C439 Malignant melanoma of skin, unspecified: Principal | ICD-10-CM

## 2019-10-25 DIAGNOSIS — Z79899 Other long term (current) drug therapy: Principal | ICD-10-CM

## 2019-10-25 MED ORDER — TRAMADOL 50 MG TABLET
ORAL_TABLET | Freq: Three times a day (TID) | ORAL | 0 refills | 10.00000 days | Status: CP | PRN
Start: 2019-10-25 — End: ?

## 2019-11-07 ENCOUNTER — Encounter: Admit: 2019-11-07 | Discharge: 2019-11-08 | Payer: PRIVATE HEALTH INSURANCE | Attending: Oncology | Primary: Oncology

## 2019-11-07 DIAGNOSIS — C799 Secondary malignant neoplasm of unspecified site: Principal | ICD-10-CM

## 2019-11-07 DIAGNOSIS — I10 Essential (primary) hypertension: Principal | ICD-10-CM

## 2019-11-15 ENCOUNTER — Non-Acute Institutional Stay: Admit: 2019-11-15 | Discharge: 2019-11-16 | Payer: PRIVATE HEALTH INSURANCE

## 2019-11-15 ENCOUNTER — Ambulatory Visit: Admit: 2019-11-15 | Discharge: 2019-11-15 | Payer: PRIVATE HEALTH INSURANCE

## 2019-11-15 ENCOUNTER — Ambulatory Visit
Admit: 2019-11-15 | Discharge: 2019-11-16 | Payer: PRIVATE HEALTH INSURANCE | Attending: Hematology & Oncology | Primary: Hematology & Oncology

## 2019-11-15 ENCOUNTER — Ambulatory Visit: Admit: 2019-11-15 | Discharge: 2019-11-16 | Payer: PRIVATE HEALTH INSURANCE

## 2019-11-15 ENCOUNTER — Encounter: Admit: 2019-11-15 | Discharge: 2019-11-16 | Payer: PRIVATE HEALTH INSURANCE

## 2019-11-15 DIAGNOSIS — C799 Secondary malignant neoplasm of unspecified site: Principal | ICD-10-CM

## 2019-11-20 DIAGNOSIS — S31809A Unspecified open wound of unspecified buttock, initial encounter: Principal | ICD-10-CM

## 2019-11-27 DIAGNOSIS — J018 Other acute sinusitis: Principal | ICD-10-CM

## 2019-11-27 MED ORDER — AZITHROMYCIN 250 MG TABLET
ORAL_TABLET | 0 refills | 5 days | Status: CP
Start: 2019-11-27 — End: 2019-12-02

## 2019-11-28 ENCOUNTER — Emergency Department (INDEPENDENT_AMBULATORY_CARE_PROVIDER_SITE_OTHER): Payer: Federal, State, Local not specified - PPO

## 2019-11-28 ENCOUNTER — Other Ambulatory Visit: Payer: Self-pay

## 2019-11-28 ENCOUNTER — Emergency Department (INDEPENDENT_AMBULATORY_CARE_PROVIDER_SITE_OTHER)
Admission: EM | Admit: 2019-11-28 | Discharge: 2019-11-28 | Disposition: A | Discharge status: Home / Self Care | Payer: Federal, State, Local not specified - PPO | Source: Home / Self Care

## 2019-11-28 DIAGNOSIS — M79651 Pain in right thigh: Secondary | ICD-10-CM

## 2019-11-28 DIAGNOSIS — R1031 Right lower quadrant pain: Secondary | ICD-10-CM

## 2019-11-28 DIAGNOSIS — M25551 Pain in right hip: Secondary | ICD-10-CM | POA: Diagnosis not present

## 2019-11-28 DIAGNOSIS — M5431 Sciatica, right side: Secondary | ICD-10-CM

## 2019-11-28 DIAGNOSIS — Z85820 Personal history of malignant melanoma of skin: Secondary | ICD-10-CM | POA: Diagnosis not present

## 2019-11-28 DIAGNOSIS — Z923 Personal history of irradiation: Secondary | ICD-10-CM | POA: Diagnosis not present

## 2019-11-28 DIAGNOSIS — M7918 Myalgia, other site: Secondary | ICD-10-CM

## 2019-11-28 NOTE — ED Triage Notes (Signed)
Patient presents to Urgent Care with complaints of right hip pain since last night. Patient reports she has been going through radiation on her pelvis and she is not sure if the special pillow she was given or her job as a Development worker, community carrier. States the pain kind of starts near her groin and radiates down her leg and also laterally around to her buttocks, has tried ibuprofen and it did not help at all.

## 2019-11-28 NOTE — ED Provider Notes (Signed)
[facility]    CSN: [id number] Arrival date & time: 11/28/19  1010      History   Chief Complaint Chief Complaint  Patient presents with  . Hip Pain    HPI Danielle Gonzales is a 54 y.o. female.   HPI Danielle Gonzales is a 54 y.o. female presenting to UC with c/o Right hip, buttock, groin and thigh pain that woke her from her sleep last night. Pain is aching and sharp at times, 8/10.  Mild numbness in her leg when sitting with her legs crossed.  Pt has a hx of melanoma of anal canal and has had radiation on her pelvis, last treatment was February 2021.  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  No change in bowel or bladder habits. No rashes or bruising to area of pain.   Past Medical History:  Diagnosis Date  . Depression   . Melanoma of anal canal [facility])     Patient Active Problem List   Diagnosis Date Noted  . Depression     Past Surgical History:  Procedure Laterality Date  . DILATION AND CURETTAGE OF UTERUS      OB History   No obstetric history on file.      Home Medications    Prior to Admission medications   Medication Sig Start Date End Date Taking? Authorizing Provider  amLODipine (NORVASC) 10 MG tablet Take 10 mg by mouth daily.   Yes [provider]  azithromycin (ZITHROMAX) 250 MG tablet Take 250 mg by mouth daily. Take for 5 days   Yes [provider]  DPH-Lido-AlHydr-MgHydr-Simeth [facility] BLM MT) Use as directed in the mouth or throat.   Yes [provider]  hydrochlorothiazide (HYDRODIURIL) 25 MG tablet Take 25 mg by mouth daily.   Yes [provider]  hydrocortisone (CORTEF) 20 MG tablet Take 20 mg by mouth daily.   Yes [provider]  lenvatinib 20 mg daily dose (LENVIMA, 20 MG DAILY DOSE,) 2 x 10 MG capsule  Take 20 mg by mouth daily.   Yes [provider]  lisinopril (ZESTRIL) 40 MG tablet Take 40 mg by mouth daily.   Yes [provider]  ondansetron (ZOFRAN) 8 MG tablet Take by mouth every 8 (eight) hours as needed for nausea or vomiting.   Yes [provider]  rosuvastatin (CRESTOR) 10 MG tablet Take by mouth.   Yes [provider]  silver sulfADIAZINE (SILVADENE) 1 % cream Apply 1 application topically daily.   Yes [provider]  traMADol (ULTRAM) 50 MG tablet Take 1 tablet (50 mg total) by mouth every 6 (six) hours as needed. 11/13/17  Yes Noe Gens, PA-C  ibuprofen (ADVIL,MOTRIN) 800 MG tablet  11/10/17   [provider]  MEKINIST 0.5 MG tablet  11/09/17   [provider]  methocarbamol (ROBAXIN) 500 MG tablet Take 1 tablet (500 mg total) by mouth 2 (two) times daily. 11/13/17   Noe Gens, PA-C  Multiple Vitamins-Minerals (MULTIVITAMIN WITH MINERALS) tablet Take 1 tablet by mouth daily.    [provider]  predniSONE (DELTASONE) 20 MG tablet Take by mouth.    [provider]  propranolol (INDERAL) 60 MG tablet Take 60 mg by mouth 3 (three) times daily.    [provider]  simvastatin (ZOCOR) 10 MG tablet Take by mouth. 08/16/16   [provider]  TAFINLAR 50 MG capsule  11/09/17   [provider]  valACYclovir (VALTREX) 1000 MG tablet Take 1 tablet (1,000 mg total) by mouth 3 (three) times daily. 11/13/17   Noe Gens, PA-C    Family History Family History  Problem Relation Age of Onset  . Healthy Mother   . Hypertension Father   . Heart failure Father     Social History Social History   Tobacco Use  . Smoking status: Current Every Day Smoker    Packs/day: 0.10    Years: 20.00    Pack years: 2.00    Types: Cigarettes  . Smokeless tobacco: Never Used  . Tobacco comment: trying to quit- depends on stress  Vaping Use  . Vaping Use: Never used  Substance Use Topics    . Alcohol use: No  . Drug use: No     Allergies   Hydromorphone, Morphine, and Oxycodone   Review of Systems Review of Systems  Musculoskeletal: Positive for myalgias. Negative for back pain and joint swelling.  Skin: Negative for color change and rash.     Physical Exam Triage Vital Signs ED Triage Vitals  Enc Vitals Group     BP 11/28/19 1047 114/77     Pulse Rate 11/28/19 1047 85     Resp 11/28/19 1047 18     Temp 11/28/19 1047 98.6 F (37 C)     Temp Source 11/28/19 1047 Oral     SpO2 11/28/19 1047 99 %     Weight --      Height --      Head Circumference --      Peak Flow --      Pain Score 11/28/19 1038 8     Pain Loc --      Pain Edu? --      Excl. in [place]? --    No data found.  Updated Vital Signs BP 114/77 (BP Location: Left Arm)   Pulse 85   Temp 98.6 F (37 C) (Oral)   Resp 18   SpO2 99%   Visual Acuity Right Eye Distance:   Left Eye Distance:   Bilateral Distance:    Right Eye Near:   Left Eye Near:    Bilateral Near:     Physical Exam Vitals and nursing note reviewed.  Constitutional:      Appearance: Normal appearance. She is well-developed.  HENT:     Head: Normocephalic and atraumatic.  Cardiovascular:     Rate and Rhythm: Normal rate.  Pulmonary:     Effort: Pulmonary effort is normal.  Musculoskeletal:        General: Tenderness present. Normal range of motion.     Cervical back: Normal range of motion.       Legs:     Comments: No bony tenderness. Full ROM Right hip w/o crepitus. Increased pain with full flexion. Negative straight leg raise  Skin:    General: Skin is warm and dry.     Findings: No bruising, erythema or rash.  Neurological:     Mental Status: She is alert and oriented to person, place, and time.  Psychiatric:        Behavior: Behavior normal.      UC Treatments / Results  Labs (all labs ordered are listed, but only abnormal results are displayed) Labs Reviewed - No data to  display  EKG   Radiology DG  Hip Unilat W or Wo Pelvis 2-3 Views Right  Result Date: 11/28/2019 CLINICAL DATA:  Right hip and buttock pain. Radiation therapy 2 pelvis. Rule out pathologic fracture. History of metastatic melanoma. EXAM: DG HIP (WITH OR WITHOUT PELVIS) 2-3V RIGHT COMPARISON:  None. FINDINGS: Femoral heads are located. Sacroiliac joints are symmetric. No focal osseous lesion. IMPRESSION: No acute osseous abnormality. Electronically Signed   By: Abigail Miyamoto M.D.   On: 11/28/2019 11:30    Procedures Procedures (including critical care time)  Medications Ordered in UC Medications - No data to display  Initial Impression / Assessment and Plan / UC Course  I have reviewed the triage vital signs and the nursing notes.  Pertinent labs & imaging results that were available during my care of the patient were reviewed by me and considered in my medical decision making (see chart for details).    Muscular tenderness noted on exam. No red flag symptoms.  Reassured pt of normal plain films Encouraged symptomatic tx Pt states she has tramadol at home she can try, does not need a refill at this time.  Encouraged f/u with PCP and/or sports medicine next week if not improving AVS provided.  Final Clinical Impressions(s) / UC Diagnoses   Final diagnoses:  Right sided sciatica  Right hip pain  Right thigh pain  Right groin pain     Discharge Instructions      You may take your previously prescribed tramadol as needed for pain. You can also try alternating cool and warm compresses and gentle stretching at home for comfort.  Call to schedule a follow up appointment with your primary care provider or sports medicine if pain not improving by next week.     ED Prescriptions    None     I have reviewed the PDMP during this encounter.   Noe Gens, [place] 11/28/19 1550

## 2019-11-28 NOTE — Discharge Instructions (Signed)
°  You may take your previously prescribed tramadol as needed for pain. You can also try alternating cool and warm compresses and gentle stretching at home for comfort.  Call to schedule a follow up appointment with your primary care provider or sports medicine if pain not improving by next week.

## 2019-11-29 DIAGNOSIS — C799 Secondary malignant neoplasm of unspecified site: Principal | ICD-10-CM

## 2019-11-29 MED ORDER — LENVIMA 14 MG/DAY(10 MG X 1-4 MG X 1) CAPSULE
ORAL_CAPSULE | Freq: Every day | ORAL | 5 refills | 0.00000 days | Status: CP
Start: 2019-11-29 — End: ?

## 2019-12-03 ENCOUNTER — Encounter: Admit: 2019-12-03 | Discharge: 2019-12-03 | Payer: PRIVATE HEALTH INSURANCE

## 2019-12-03 ENCOUNTER — Encounter
Admit: 2019-12-03 | Discharge: 2019-12-03 | Payer: PRIVATE HEALTH INSURANCE | Attending: Hematology & Oncology | Primary: Hematology & Oncology

## 2019-12-03 ENCOUNTER — Ambulatory Visit: Admit: 2019-12-03 | Discharge: 2019-12-03 | Payer: PRIVATE HEALTH INSURANCE | Attending: Oncology | Primary: Oncology

## 2019-12-03 ENCOUNTER — Ambulatory Visit: Admit: 2019-12-03 | Discharge: 2019-12-03 | Payer: PRIVATE HEALTH INSURANCE

## 2019-12-03 DIAGNOSIS — C799 Secondary malignant neoplasm of unspecified site: Principal | ICD-10-CM

## 2019-12-27 ENCOUNTER — Encounter
Admit: 2019-12-27 | Discharge: 2019-12-27 | Payer: PRIVATE HEALTH INSURANCE | Attending: Hematology & Oncology | Primary: Hematology & Oncology

## 2019-12-27 ENCOUNTER — Encounter: Admit: 2019-12-27 | Discharge: 2019-12-27 | Payer: PRIVATE HEALTH INSURANCE

## 2019-12-27 DIAGNOSIS — C799 Secondary malignant neoplasm of unspecified site: Principal | ICD-10-CM

## 2020-01-15 DIAGNOSIS — G893 Neoplasm related pain (acute) (chronic): Principal | ICD-10-CM

## 2020-01-15 MED ORDER — HYDROCODONE 5 MG-ACETAMINOPHEN 325 MG TABLET
ORAL_TABLET | Freq: Four times a day (QID) | ORAL | 0 refills | 5 days | Status: CP | PRN
Start: 2020-01-15 — End: ?

## 2020-01-17 ENCOUNTER — Encounter: Admit: 2020-01-17 | Discharge: 2020-01-18 | Payer: PRIVATE HEALTH INSURANCE | Attending: Oncology | Primary: Oncology

## 2020-01-17 ENCOUNTER — Ambulatory Visit: Admit: 2020-01-17 | Discharge: 2020-01-18 | Payer: PRIVATE HEALTH INSURANCE | Attending: Oncology | Primary: Oncology

## 2020-01-17 ENCOUNTER — Encounter: Admit: 2020-01-17 | Discharge: 2020-01-18 | Payer: PRIVATE HEALTH INSURANCE

## 2020-01-17 ENCOUNTER — Encounter
Admit: 2020-01-17 | Discharge: 2020-01-18 | Payer: PRIVATE HEALTH INSURANCE | Attending: Adult Health | Primary: Adult Health

## 2020-01-17 ENCOUNTER — Ambulatory Visit
Admit: 2020-01-17 | Discharge: 2020-01-18 | Payer: PRIVATE HEALTH INSURANCE | Attending: Adult Health | Primary: Adult Health

## 2020-01-17 DIAGNOSIS — C799 Secondary malignant neoplasm of unspecified site: Principal | ICD-10-CM

## 2020-01-17 DIAGNOSIS — C7931 Secondary malignant neoplasm of brain: Principal | ICD-10-CM

## 2020-01-17 MED ORDER — BINIMETINIB 15 MG TABLET
ORAL_TABLET | Freq: Two times a day (BID) | ORAL | 5 refills | 30.00000 days | Status: CP
Start: 2020-01-17 — End: ?

## 2020-01-17 MED ORDER — ENCORAFENIB 75 MG CAPSULE
ORAL_CAPSULE | Freq: Every day | ORAL | 5 refills | 30.00000 days | Status: CP
Start: 2020-01-17 — End: ?

## 2020-01-17 MED ORDER — OXYCODONE 5 MG TABLET
ORAL_TABLET | ORAL | 0 refills | 10 days | Status: CP | PRN
Start: 2020-01-17 — End: ?

## 2020-01-17 MED ORDER — GABAPENTIN 300 MG CAPSULE
ORAL_CAPSULE | 3 refills | 0 days | Status: CP
Start: 2020-01-17 — End: ?

## 2020-01-28 ENCOUNTER — Encounter: Admit: 2020-01-28 | Discharge: 2020-01-29 | Payer: PRIVATE HEALTH INSURANCE | Attending: Oncology | Primary: Oncology

## 2020-02-06 ENCOUNTER — Ambulatory Visit: Admit: 2020-02-06 | Discharge: 2020-02-07 | Payer: PRIVATE HEALTH INSURANCE

## 2020-02-07 ENCOUNTER — Encounter: Admit: 2020-02-07 | Discharge: 2020-02-07 | Payer: PRIVATE HEALTH INSURANCE

## 2020-02-07 ENCOUNTER — Encounter
Admit: 2020-02-07 | Discharge: 2020-02-07 | Payer: PRIVATE HEALTH INSURANCE | Attending: Hematology & Oncology | Primary: Hematology & Oncology

## 2020-02-07 ENCOUNTER — Encounter: Admit: 2020-02-07 | Discharge: 2020-02-07 | Payer: PRIVATE HEALTH INSURANCE | Attending: Oncology | Primary: Oncology

## 2020-02-07 DIAGNOSIS — C799 Secondary malignant neoplasm of unspecified site: Principal | ICD-10-CM

## 2020-02-07 DIAGNOSIS — C7931 Secondary malignant neoplasm of brain: Principal | ICD-10-CM

## 2020-02-07 MED ORDER — GABAPENTIN 300 MG CAPSULE
ORAL_CAPSULE | Freq: Two times a day (BID) | ORAL | 3 refills | 30 days | Status: CP
Start: 2020-02-07 — End: ?

## 2020-02-07 MED ORDER — APIXABAN 5 MG TABLET
ORAL_TABLET | Freq: Two times a day (BID) | ORAL | 5 refills | 30 days | Status: CP
Start: 2020-02-07 — End: ?

## 2020-02-07 MED ORDER — OXYCODONE 5 MG TABLET
ORAL_TABLET | ORAL | 0 refills | 5.00000 days | Status: CP | PRN
Start: 2020-02-07 — End: ?

## 2020-02-10 ENCOUNTER — Ambulatory Visit: Admit: 2020-02-10 | Discharge: 2020-02-11 | Payer: PRIVATE HEALTH INSURANCE | Attending: Surgery | Primary: Surgery

## 2020-02-10 DIAGNOSIS — C799 Secondary malignant neoplasm of unspecified site: Principal | ICD-10-CM

## 2020-02-12 ENCOUNTER — Ambulatory Visit: Admit: 2020-02-12 | Discharge: 2020-02-13 | Payer: PRIVATE HEALTH INSURANCE

## 2020-02-17 DIAGNOSIS — C439 Malignant melanoma of skin, unspecified: Principal | ICD-10-CM

## 2020-02-18 ENCOUNTER — Encounter
Admit: 2020-02-18 | Discharge: 2020-02-18 | Payer: PRIVATE HEALTH INSURANCE | Attending: Hematology & Oncology | Primary: Hematology & Oncology

## 2020-02-18 ENCOUNTER — Other Ambulatory Visit: Admit: 2020-02-18 | Discharge: 2020-02-18 | Payer: PRIVATE HEALTH INSURANCE

## 2020-02-18 ENCOUNTER — Ambulatory Visit: Admit: 2020-02-18 | Discharge: 2020-02-18 | Payer: PRIVATE HEALTH INSURANCE

## 2020-02-18 DIAGNOSIS — C439 Malignant melanoma of skin, unspecified: Principal | ICD-10-CM

## 2020-02-19 DIAGNOSIS — C439 Malignant melanoma of skin, unspecified: Principal | ICD-10-CM

## 2020-02-20 DIAGNOSIS — C439 Malignant melanoma of skin, unspecified: Principal | ICD-10-CM

## 2020-02-25 DIAGNOSIS — C439 Malignant melanoma of skin, unspecified: Principal | ICD-10-CM

## 2020-02-27 DIAGNOSIS — C50919 Malignant neoplasm of unspecified site of unspecified female breast: Principal | ICD-10-CM

## 2020-02-27 DIAGNOSIS — C439 Malignant melanoma of skin, unspecified: Principal | ICD-10-CM

## 2020-02-28 DIAGNOSIS — C439 Malignant melanoma of skin, unspecified: Principal | ICD-10-CM

## 2020-02-28 MED ORDER — HYDROCORTISONE 20 MG TABLET
ORAL_TABLET | 0 refills | 0 days
Start: 2020-02-28 — End: ?

## 2020-03-02 ENCOUNTER — Encounter: Admit: 2020-03-02 | Discharge: 2020-03-03 | Payer: PRIVATE HEALTH INSURANCE

## 2020-03-03 DIAGNOSIS — C799 Secondary malignant neoplasm of unspecified site: Principal | ICD-10-CM

## 2020-03-03 MED ORDER — HYDROCORTISONE 20 MG TABLET
ORAL_TABLET | 3 refills | 0 days | Status: CP
Start: 2020-03-03 — End: ?

## 2020-03-04 ENCOUNTER — Ambulatory Visit: Admit: 2020-03-04 | Discharge: 2020-03-04 | Payer: PRIVATE HEALTH INSURANCE

## 2020-03-04 ENCOUNTER — Encounter: Admit: 2020-03-04 | Discharge: 2020-03-04 | Payer: PRIVATE HEALTH INSURANCE

## 2020-03-04 DIAGNOSIS — R432 Parageusia: Principal | ICD-10-CM

## 2020-03-04 DIAGNOSIS — L03317 Cellulitis of buttock: Principal | ICD-10-CM

## 2020-03-04 DIAGNOSIS — C799 Secondary malignant neoplasm of unspecified site: Principal | ICD-10-CM

## 2020-03-04 DIAGNOSIS — C50919 Malignant neoplasm of unspecified site of unspecified female breast: Principal | ICD-10-CM

## 2020-03-04 DIAGNOSIS — E876 Hypokalemia: Principal | ICD-10-CM

## 2020-03-04 MED ORDER — POTASSIUM CHLORIDE ER 20 MEQ TABLET,EXTENDED RELEASE(PART/CRYST)
ORAL_TABLET | Freq: Every day | ORAL | 11 refills | 30.00000 days | Status: CP
Start: 2020-03-04 — End: 2021-03-04

## 2020-03-04 MED ORDER — CEPHALEXIN 500 MG CAPSULE
ORAL_CAPSULE | Freq: Four times a day (QID) | ORAL | 0 refills | 10 days | Status: CP
Start: 2020-03-04 — End: 2020-03-14

## 2020-03-05 DIAGNOSIS — C439 Malignant melanoma of skin, unspecified: Principal | ICD-10-CM

## 2020-03-08 ENCOUNTER — Ambulatory Visit: Admit: 2020-03-08 | Discharge: 2020-03-09 | Payer: PRIVATE HEALTH INSURANCE

## 2020-03-13 ENCOUNTER — Encounter: Admit: 2020-03-13 | Payer: PRIVATE HEALTH INSURANCE

## 2020-03-19 MED ORDER — OXYCODONE 5 MG TABLET
ORAL_TABLET | ORAL | 0 refills | 2 days | Status: CP | PRN
Start: 2020-03-19 — End: ?

## 2020-03-20 ENCOUNTER — Encounter
Admit: 2020-03-20 | Discharge: 2020-03-21 | Payer: PRIVATE HEALTH INSURANCE | Attending: Hematology & Oncology | Primary: Hematology & Oncology

## 2020-03-20 DIAGNOSIS — C7931 Secondary malignant neoplasm of brain: Principal | ICD-10-CM

## 2020-03-20 DIAGNOSIS — C799 Secondary malignant neoplasm of unspecified site: Principal | ICD-10-CM

## 2020-03-20 DIAGNOSIS — Z9289 Personal history of other medical treatment: Principal | ICD-10-CM

## 2020-03-20 MED ORDER — LENVIMA 14 MG/DAY(10 MG X 1-4 MG X 1) CAPSULE
ORAL_CAPSULE | Freq: Every day | ORAL | 5 refills | 0.00000 days | Status: CP
Start: 2020-03-20 — End: ?

## 2020-03-20 MED ORDER — HYDROCODONE 5 MG-ACETAMINOPHEN 325 MG TABLET
ORAL_TABLET | Freq: Four times a day (QID) | ORAL | 0 refills | 13 days | Status: CP | PRN
Start: 2020-03-20 — End: ?

## 2020-03-27 ENCOUNTER — Ambulatory Visit: Admit: 2020-03-27 | Discharge: 2020-03-28 | Payer: PRIVATE HEALTH INSURANCE

## 2020-03-27 DIAGNOSIS — C799 Secondary malignant neoplasm of unspecified site: Principal | ICD-10-CM

## 2020-04-03 DIAGNOSIS — C799 Secondary malignant neoplasm of unspecified site: Principal | ICD-10-CM

## 2020-04-03 MED ORDER — CARVEDILOL 6.25 MG TABLET
ORAL_TABLET | Freq: Two times a day (BID) | ORAL | 1 refills | 30.00000 days | Status: CP
Start: 2020-04-03 — End: 2021-04-03

## 2020-04-08 ENCOUNTER — Ambulatory Visit: Admit: 2020-04-08 | Discharge: 2020-04-09 | Payer: PRIVATE HEALTH INSURANCE

## 2020-04-08 ENCOUNTER — Encounter: Admit: 2020-04-08 | Discharge: 2020-04-09 | Payer: PRIVATE HEALTH INSURANCE | Attending: Oncology | Primary: Oncology

## 2020-04-17 ENCOUNTER — Ambulatory Visit: Admit: 2020-04-17 | Discharge: 2020-04-17 | Payer: PRIVATE HEALTH INSURANCE

## 2020-04-17 ENCOUNTER — Encounter: Admit: 2020-04-17 | Discharge: 2020-04-17 | Payer: PRIVATE HEALTH INSURANCE

## 2020-04-17 ENCOUNTER — Encounter
Admit: 2020-04-17 | Discharge: 2020-04-17 | Payer: PRIVATE HEALTH INSURANCE | Attending: Hematology & Oncology | Primary: Hematology & Oncology

## 2020-04-17 DIAGNOSIS — C799 Secondary malignant neoplasm of unspecified site: Principal | ICD-10-CM

## 2020-04-23 MED ORDER — APIXABAN 5 MG TABLET
ORAL_TABLET | Freq: Two times a day (BID) | ORAL | 3 refills | 90.00000 days | Status: CP
Start: 2020-04-23 — End: ?

## 2020-05-04 DIAGNOSIS — C7931 Secondary malignant neoplasm of brain: Principal | ICD-10-CM

## 2020-05-06 DIAGNOSIS — C799 Secondary malignant neoplasm of unspecified site: Principal | ICD-10-CM

## 2020-05-08 ENCOUNTER — Encounter: Admit: 2020-05-08 | Discharge: 2020-05-09 | Payer: PRIVATE HEALTH INSURANCE

## 2020-05-08 DIAGNOSIS — C7931 Secondary malignant neoplasm of brain: Principal | ICD-10-CM

## 2020-05-08 DIAGNOSIS — Z5112 Encounter for antineoplastic immunotherapy: Principal | ICD-10-CM

## 2020-05-08 DIAGNOSIS — C799 Secondary malignant neoplasm of unspecified site: Principal | ICD-10-CM

## 2020-05-21 DIAGNOSIS — C799 Secondary malignant neoplasm of unspecified site: Principal | ICD-10-CM

## 2020-05-21 DIAGNOSIS — C439 Malignant melanoma of skin, unspecified: Principal | ICD-10-CM

## 2020-05-22 ENCOUNTER — Encounter: Admit: 2020-05-22 | Discharge: 2020-05-22 | Payer: PRIVATE HEALTH INSURANCE

## 2020-05-22 ENCOUNTER — Ambulatory Visit
Admit: 2020-05-22 | Discharge: 2020-05-22 | Payer: PRIVATE HEALTH INSURANCE | Attending: Hematology & Oncology | Primary: Hematology & Oncology

## 2020-05-22 DIAGNOSIS — R918 Other nonspecific abnormal finding of lung field: Principal | ICD-10-CM

## 2020-05-22 DIAGNOSIS — Z7984 Long term (current) use of oral hypoglycemic drugs: Principal | ICD-10-CM

## 2020-05-22 DIAGNOSIS — R590 Localized enlarged lymph nodes: Principal | ICD-10-CM

## 2020-05-22 DIAGNOSIS — Z9221 Personal history of antineoplastic chemotherapy: Principal | ICD-10-CM

## 2020-05-22 DIAGNOSIS — C799 Secondary malignant neoplasm of unspecified site: Principal | ICD-10-CM

## 2020-05-22 DIAGNOSIS — G936 Cerebral edema: Principal | ICD-10-CM

## 2020-05-22 DIAGNOSIS — I10 Essential (primary) hypertension: Principal | ICD-10-CM

## 2020-05-22 DIAGNOSIS — C7931 Secondary malignant neoplasm of brain: Principal | ICD-10-CM

## 2020-05-22 DIAGNOSIS — K649 Unspecified hemorrhoids: Principal | ICD-10-CM

## 2020-05-22 DIAGNOSIS — R410 Disorientation, unspecified: Principal | ICD-10-CM

## 2020-05-22 DIAGNOSIS — N6310 Unspecified lump in the right breast, unspecified quadrant: Principal | ICD-10-CM

## 2020-05-22 DIAGNOSIS — R112 Nausea with vomiting, unspecified: Principal | ICD-10-CM

## 2020-05-22 DIAGNOSIS — C439 Malignant melanoma of skin, unspecified: Principal | ICD-10-CM

## 2020-05-22 DIAGNOSIS — Z7901 Long term (current) use of anticoagulants: Principal | ICD-10-CM

## 2020-05-22 DIAGNOSIS — K5100 Ulcerative (chronic) pancolitis without complications: Principal | ICD-10-CM

## 2020-05-22 DIAGNOSIS — I82411 Acute embolism and thrombosis of right femoral vein: Principal | ICD-10-CM

## 2020-05-22 DIAGNOSIS — E860 Dehydration: Principal | ICD-10-CM

## 2020-05-22 DIAGNOSIS — R21 Rash and other nonspecific skin eruption: Principal | ICD-10-CM

## 2020-05-22 DIAGNOSIS — Z79899 Other long term (current) drug therapy: Principal | ICD-10-CM

## 2020-05-28 ENCOUNTER — Encounter: Admit: 2020-05-28 | Discharge: 2020-05-29 | Payer: PRIVATE HEALTH INSURANCE

## 2020-05-28 DIAGNOSIS — C799 Secondary malignant neoplasm of unspecified site: Principal | ICD-10-CM

## 2020-05-28 DIAGNOSIS — Z5112 Encounter for antineoplastic immunotherapy: Principal | ICD-10-CM

## 2020-05-28 MED ORDER — LENVIMA 20 MG/DAY (10 MG X 2) CAPSULE
ORAL_CAPSULE | Freq: Every day | ORAL | 5 refills | 30 days | Status: CP
Start: 2020-05-28 — End: 2020-06-03

## 2020-06-03 DIAGNOSIS — G893 Neoplasm related pain (acute) (chronic): Principal | ICD-10-CM

## 2020-06-03 DIAGNOSIS — C799 Secondary malignant neoplasm of unspecified site: Principal | ICD-10-CM

## 2020-06-03 MED ORDER — LENVIMA 20 MG/DAY (10 MG X 2) CAPSULE
ORAL_CAPSULE | Freq: Every day | ORAL | 5 refills | 30.00000 days | Status: CP
Start: 2020-06-03 — End: 2020-07-17

## 2020-06-03 MED ORDER — HYDROCODONE 5 MG-ACETAMINOPHEN 325 MG TABLET
ORAL_TABLET | Freq: Three times a day (TID) | ORAL | 0 refills | 7 days | Status: CP | PRN
Start: 2020-06-03 — End: 2020-06-26

## 2020-06-26 ENCOUNTER — Ambulatory Visit: Admit: 2020-06-26 | Discharge: 2020-06-26 | Payer: PRIVATE HEALTH INSURANCE

## 2020-06-26 ENCOUNTER — Ambulatory Visit
Admit: 2020-06-26 | Discharge: 2020-06-26 | Payer: PRIVATE HEALTH INSURANCE | Attending: Hematology & Oncology | Primary: Hematology & Oncology

## 2020-06-26 ENCOUNTER — Encounter: Admit: 2020-06-26 | Discharge: 2020-06-26 | Payer: PRIVATE HEALTH INSURANCE

## 2020-06-26 DIAGNOSIS — R519 Headache, unspecified: Principal | ICD-10-CM

## 2020-06-26 DIAGNOSIS — G893 Neoplasm related pain (acute) (chronic): Principal | ICD-10-CM

## 2020-06-26 DIAGNOSIS — C799 Secondary malignant neoplasm of unspecified site: Principal | ICD-10-CM

## 2020-06-26 DIAGNOSIS — G44019 Episodic cluster headache, not intractable: Principal | ICD-10-CM

## 2020-06-26 DIAGNOSIS — S31809A Unspecified open wound of unspecified buttock, initial encounter: Principal | ICD-10-CM

## 2020-06-26 DIAGNOSIS — Z5112 Encounter for antineoplastic immunotherapy: Principal | ICD-10-CM

## 2020-06-26 DIAGNOSIS — R1031 Right lower quadrant pain: Principal | ICD-10-CM

## 2020-06-26 DIAGNOSIS — C7931 Secondary malignant neoplasm of brain: Principal | ICD-10-CM

## 2020-06-26 DIAGNOSIS — Z5111 Encounter for antineoplastic chemotherapy: Principal | ICD-10-CM

## 2020-06-26 MED ORDER — HYDROCODONE 5 MG-ACETAMINOPHEN 325 MG TABLET
ORAL_TABLET | Freq: Three times a day (TID) | ORAL | 0 refills | 20 days | Status: CP | PRN
Start: 2020-06-26 — End: ?

## 2020-06-27 MED ORDER — HYDROCORTISONE 20 MG TABLET
ORAL_TABLET | 0 refills | 0 days
Start: 2020-06-27 — End: ?

## 2020-06-29 MED ORDER — HYDROCORTISONE 20 MG TABLET
ORAL_TABLET | TOPICAL | 0 refills | 0.00000 days | Status: CP
Start: 2020-06-29 — End: ?

## 2020-07-14 DIAGNOSIS — C799 Secondary malignant neoplasm of unspecified site: Principal | ICD-10-CM

## 2020-07-14 MED ORDER — LENVIMA 14 MG/DAY(10 MG X 1-4 MG X 1) CAPSULE
ORAL_CAPSULE | Freq: Every day | ORAL | 5 refills | 0.00000 days | Status: CP
Start: 2020-07-14 — End: 2020-07-17

## 2020-07-16 ENCOUNTER — Ambulatory Visit: Admit: 2020-07-16 | Discharge: 2020-07-17 | Payer: PRIVATE HEALTH INSURANCE

## 2020-07-16 DIAGNOSIS — C799 Secondary malignant neoplasm of unspecified site: Principal | ICD-10-CM

## 2020-07-17 DIAGNOSIS — C799 Secondary malignant neoplasm of unspecified site: Principal | ICD-10-CM

## 2020-07-17 MED ORDER — LENVIMA 14 MG/DAY(10 MG X 1-4 MG X 1) CAPSULE
ORAL_CAPSULE | Freq: Every day | ORAL | 5 refills | 0.00000 days | Status: CP
Start: 2020-07-17 — End: 2020-08-11

## 2020-07-17 MED ORDER — LENVIMA 20 MG/DAY (10 MG X 2) CAPSULE
ORAL_CAPSULE | Freq: Every day | ORAL | 5 refills | 30.00000 days | Status: CP
Start: 2020-07-17 — End: 2020-08-11

## 2020-08-11 DIAGNOSIS — C799 Secondary malignant neoplasm of unspecified site: Principal | ICD-10-CM

## 2020-08-11 MED ORDER — ENCORAFENIB 75 MG CAPSULE
ORAL_CAPSULE | Freq: Every day | ORAL | 5 refills | 30 days | Status: CP
Start: 2020-08-11 — End: 2020-09-10

## 2020-08-11 MED ORDER — BINIMETINIB 15 MG TABLET
ORAL_TABLET | Freq: Two times a day (BID) | ORAL | 5 refills | 30 days | Status: CP
Start: 2020-08-11 — End: ?

## 2020-08-14 DIAGNOSIS — C799 Secondary malignant neoplasm of unspecified site: Principal | ICD-10-CM

## 2020-08-14 DIAGNOSIS — G893 Neoplasm related pain (acute) (chronic): Principal | ICD-10-CM

## 2020-08-14 MED ORDER — HYDROCORTISONE SODIUM SUCCINATE 100 MG SOLUTION FOR INJECTION
Freq: Once | INTRAMUSCULAR | 0 refills | 0 days | Status: CP | PRN
Start: 2020-08-14 — End: ?

## 2020-08-14 MED ORDER — HYDROCODONE 5 MG-ACETAMINOPHEN 325 MG TABLET
ORAL_TABLET | Freq: Three times a day (TID) | ORAL | 0 refills | 20 days | Status: CP | PRN
Start: 2020-08-14 — End: ?

## 2020-08-25 ENCOUNTER — Encounter: Admit: 2020-08-25 | Discharge: 2020-08-25 | Payer: PRIVATE HEALTH INSURANCE

## 2020-08-25 ENCOUNTER — Encounter
Admit: 2020-08-25 | Discharge: 2020-08-25 | Payer: PRIVATE HEALTH INSURANCE | Attending: Hematology & Oncology | Primary: Hematology & Oncology

## 2020-08-25 ENCOUNTER — Ambulatory Visit
Admit: 2020-08-25 | Discharge: 2020-08-25 | Payer: PRIVATE HEALTH INSURANCE | Attending: Hematology & Oncology | Primary: Hematology & Oncology

## 2020-08-25 DIAGNOSIS — C799 Secondary malignant neoplasm of unspecified site: Principal | ICD-10-CM

## 2020-09-11 MED ORDER — OXYCODONE 5 MG TABLET
ORAL_TABLET | ORAL | 0 refills | 4 days | Status: CP | PRN
Start: 2020-09-11 — End: ?

## 2020-09-11 MED ORDER — ONDANSETRON 8 MG DISINTEGRATING TABLET
ORAL_TABLET | Freq: Three times a day (TID) | ORAL | 1 refills | 10.00000 days | Status: CN | PRN
Start: 2020-09-11 — End: ?

## 2020-09-22 MED ORDER — GABAPENTIN 300 MG CAPSULE
ORAL_CAPSULE | Freq: Two times a day (BID) | ORAL | 3 refills | 30 days | Status: CP
Start: 2020-09-22 — End: ?

## 2020-09-24 DIAGNOSIS — C799 Secondary malignant neoplasm of unspecified site: Principal | ICD-10-CM

## 2020-09-25 ENCOUNTER — Encounter
Admit: 2020-09-25 | Discharge: 2020-09-25 | Payer: PRIVATE HEALTH INSURANCE | Attending: Hematology & Oncology | Primary: Hematology & Oncology

## 2020-09-25 ENCOUNTER — Non-Acute Institutional Stay: Admit: 2020-09-25 | Discharge: 2020-09-25 | Payer: PRIVATE HEALTH INSURANCE

## 2020-09-25 ENCOUNTER — Encounter: Admit: 2020-09-25 | Discharge: 2020-09-25 | Payer: PRIVATE HEALTH INSURANCE

## 2020-09-25 DIAGNOSIS — C799 Secondary malignant neoplasm of unspecified site: Principal | ICD-10-CM

## 2020-09-25 DIAGNOSIS — Z9289 Personal history of other medical treatment: Principal | ICD-10-CM

## 2020-09-25 MED ORDER — HYDROXYCHLOROQUINE 200 MG TABLET
ORAL_TABLET | Freq: Two times a day (BID) | ORAL | 1 refills | 30 days | Status: CP
Start: 2020-09-25 — End: 2021-09-25

## 2020-10-09 ENCOUNTER — Encounter: Admit: 2020-10-09 | Discharge: 2020-10-09 | Payer: PRIVATE HEALTH INSURANCE

## 2020-10-09 ENCOUNTER — Encounter
Admit: 2020-10-09 | Discharge: 2020-10-09 | Payer: PRIVATE HEALTH INSURANCE | Attending: Hematology & Oncology | Primary: Hematology & Oncology

## 2020-10-09 ENCOUNTER — Non-Acute Institutional Stay: Admit: 2020-10-09 | Discharge: 2020-10-09 | Payer: PRIVATE HEALTH INSURANCE

## 2020-10-09 DIAGNOSIS — C799 Secondary malignant neoplasm of unspecified site: Principal | ICD-10-CM

## 2020-10-09 DIAGNOSIS — C439 Malignant melanoma of skin, unspecified: Principal | ICD-10-CM

## 2020-10-09 MED ORDER — HYDROXYCHLOROQUINE 200 MG TABLET
ORAL_TABLET | Freq: Two times a day (BID) | ORAL | 1 refills | 30 days | Status: CP
Start: 2020-10-09 — End: 2021-10-09

## 2020-10-14 MED ORDER — HYDROCODONE 5 MG-ACETAMINOPHEN 325 MG TABLET
ORAL_TABLET | Freq: Four times a day (QID) | ORAL | 0 refills | 3 days | Status: CP | PRN
Start: 2020-10-14 — End: ?

## 2020-10-14 MED ORDER — CARVEDILOL 12.5 MG TABLET
0 days
Start: 2020-10-14 — End: ?

## 2020-10-20 DIAGNOSIS — C799 Secondary malignant neoplasm of unspecified site: Principal | ICD-10-CM

## 2020-10-20 MED ORDER — TEMOZOLOMIDE 180 MG CAPSULE
ORAL_CAPSULE | Freq: Every day | ORAL | 1 refills | 5 days | Status: CP
Start: 2020-10-20 — End: 2020-10-25

## 2020-10-21 DIAGNOSIS — C799 Secondary malignant neoplasm of unspecified site: Principal | ICD-10-CM

## 2020-10-21 DIAGNOSIS — G893 Neoplasm related pain (acute) (chronic): Principal | ICD-10-CM

## 2020-10-21 MED ORDER — HYDROCODONE 10 MG-ACETAMINOPHEN 325 MG TABLET
ORAL_TABLET | ORAL | 0 refills | 10 days | Status: CP | PRN
Start: 2020-10-21 — End: ?

## 2020-10-21 MED ORDER — TEMOZOLOMIDE 180 MG CAPSULE
ORAL_CAPSULE | Freq: Every day | ORAL | 1 refills | 5.00000 days | Status: CP
Start: 2020-10-21 — End: 2020-10-26

## 2020-10-23 ENCOUNTER — Encounter: Admit: 2020-10-23 | Discharge: 2020-10-23 | Payer: PRIVATE HEALTH INSURANCE

## 2020-10-23 ENCOUNTER — Encounter
Admit: 2020-10-23 | Discharge: 2020-10-23 | Payer: PRIVATE HEALTH INSURANCE | Attending: Hematology & Oncology | Primary: Hematology & Oncology

## 2020-10-23 ENCOUNTER — Encounter: Admit: 2020-10-23 | Discharge: 2020-10-23 | Payer: PRIVATE HEALTH INSURANCE | Attending: Oncology | Primary: Oncology

## 2020-10-23 DIAGNOSIS — C799 Secondary malignant neoplasm of unspecified site: Principal | ICD-10-CM

## 2020-10-23 MED ORDER — LISINOPRIL 40 MG TABLET
ORAL_TABLET | Freq: Every day | ORAL | 11 refills | 30 days | Status: CP
Start: 2020-10-23 — End: 2021-10-23

## 2020-10-23 MED ORDER — GABAPENTIN 300 MG CAPSULE
ORAL_CAPSULE | Freq: Three times a day (TID) | ORAL | 3 refills | 30 days | Status: CP
Start: 2020-10-23 — End: ?

## 2020-10-23 MED ORDER — ONDANSETRON 8 MG DISINTEGRATING TABLET
ORAL_TABLET | Freq: Three times a day (TID) | ORAL | 1 refills | 10 days | Status: CP | PRN
Start: 2020-10-23 — End: ?

## 2020-11-11 MED ORDER — TEMOZOLOMIDE 180 MG CAPSULE
0 days
Start: 2020-11-11 — End: ?

## 2020-11-17 DIAGNOSIS — G893 Neoplasm related pain (acute) (chronic): Principal | ICD-10-CM

## 2020-11-17 MED ORDER — HYDROCODONE 10 MG-ACETAMINOPHEN 325 MG TABLET
ORAL_TABLET | ORAL | 0 refills | 10 days | Status: CP | PRN
Start: 2020-11-17 — End: ?

## 2020-11-27 ENCOUNTER — Institutional Professional Consult (permissible substitution): Admit: 2020-11-27 | Payer: PRIVATE HEALTH INSURANCE | Attending: Hematology & Oncology | Primary: Hematology & Oncology

## 2020-11-27 DIAGNOSIS — C799 Secondary malignant neoplasm of unspecified site: Principal | ICD-10-CM

## 2020-11-27 MED ORDER — OLAPARIB 150 MG TABLET
ORAL_TABLET | Freq: Two times a day (BID) | ORAL | 5 refills | 30 days | Status: CP
Start: 2020-11-27 — End: ?

## 2020-12-01 MED ORDER — HYDROCORTISONE 20 MG TABLET
ORAL_TABLET | TOPICAL | 3 refills | 0.00000 days | Status: CP
Start: 2020-12-01 — End: ?

## 2020-12-03 DIAGNOSIS — G893 Neoplasm related pain (acute) (chronic): Principal | ICD-10-CM

## 2020-12-03 MED ORDER — HYDROCODONE 10 MG-ACETAMINOPHEN 325 MG TABLET
ORAL_TABLET | ORAL | 0 refills | 10 days | Status: CP | PRN
Start: 2020-12-03 — End: ?

## 2020-12-15 DIAGNOSIS — C20 Malignant neoplasm of rectum: Principal | ICD-10-CM

## 2020-12-18 DIAGNOSIS — G893 Neoplasm related pain (acute) (chronic): Principal | ICD-10-CM

## 2020-12-18 MED ORDER — HYDROCODONE 10 MG-ACETAMINOPHEN 325 MG TABLET
ORAL_TABLET | ORAL | 0 refills | 10.00000 days | Status: CP | PRN
Start: 2020-12-18 — End: ?

## 2020-12-23 ENCOUNTER — Ambulatory Visit: Admit: 2020-12-23 | Payer: PRIVATE HEALTH INSURANCE | Attending: Surgery | Primary: Surgery

## 2020-12-23 ENCOUNTER — Ambulatory Visit: Admit: 2020-12-23 | Payer: PRIVATE HEALTH INSURANCE | Attending: Hematology & Oncology | Primary: Hematology & Oncology

## 2021-01-18 DEATH — deceased

## 2021-09-21 IMAGING — DX DG HIP (WITH OR WITHOUT PELVIS) 2-3V*R*
3 series · 3 of 3 positions shown · non-contrast
Comparison: None.

CLINICAL DATA: Right hip and buttock pain. Radiation therapy 2
pelvis. Rule out pathologic fracture. History of metastatic
melanoma.

EXAM:
DG HIP (WITH OR WITHOUT PELVIS) 2-3V RIGHT

[pelvis ap]
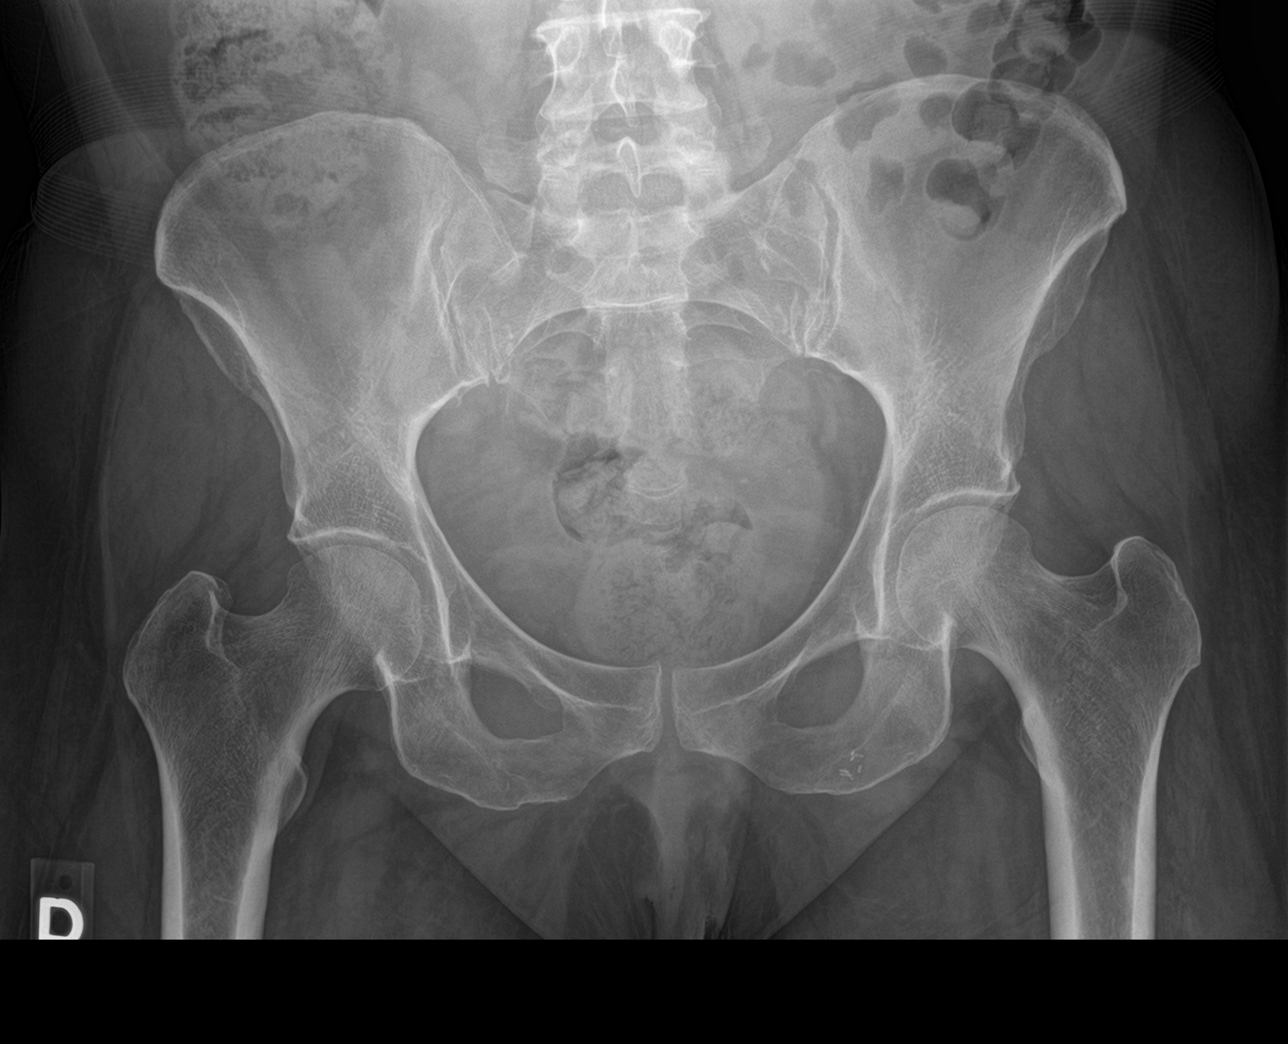

[hip ap]
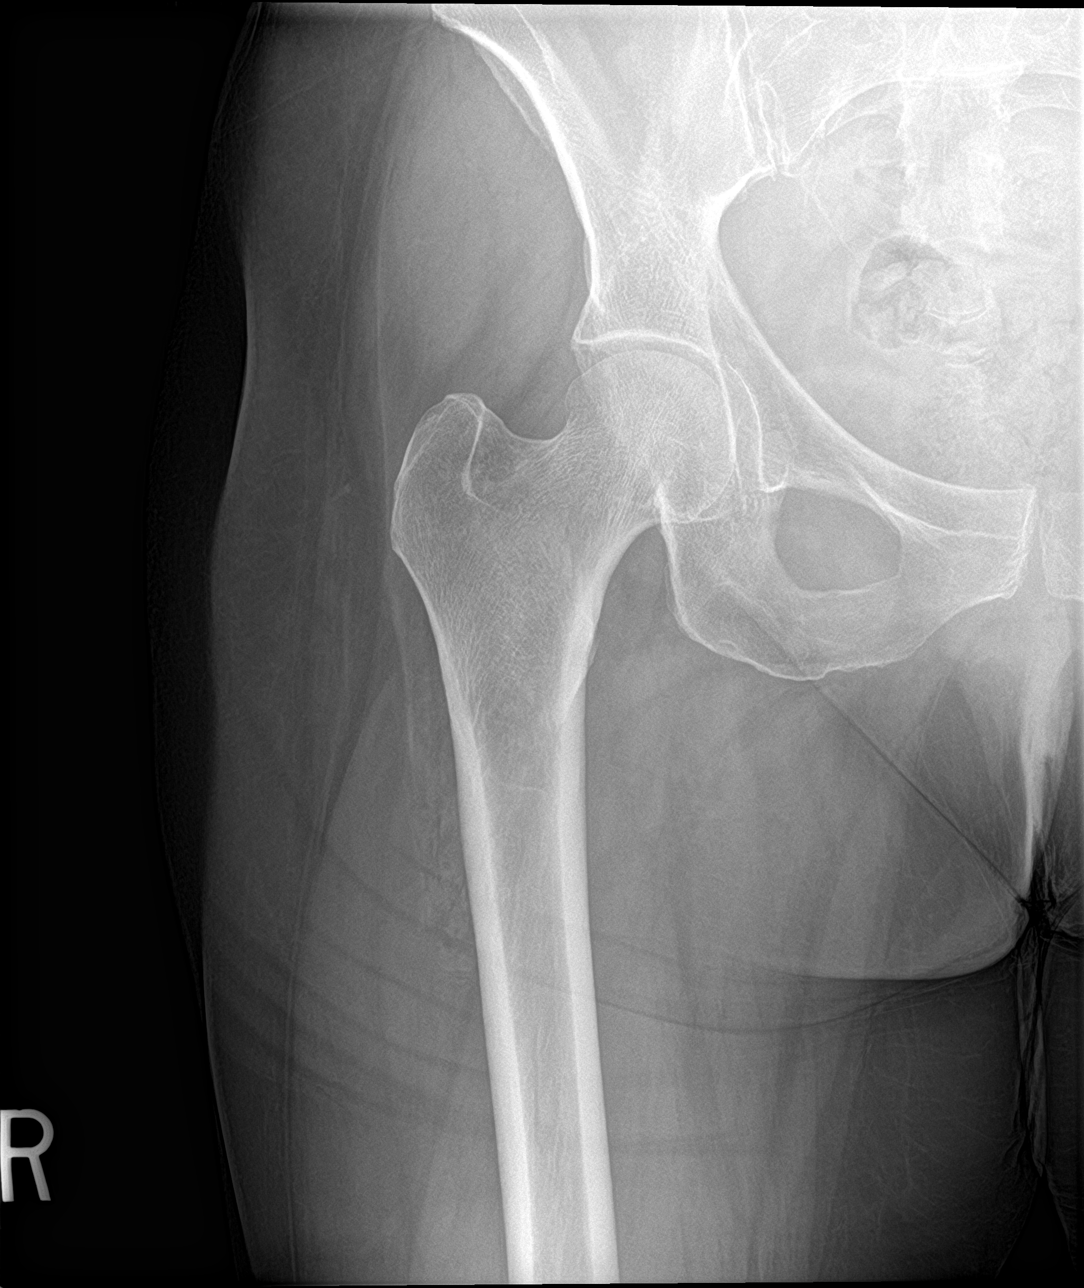

[hip lat]
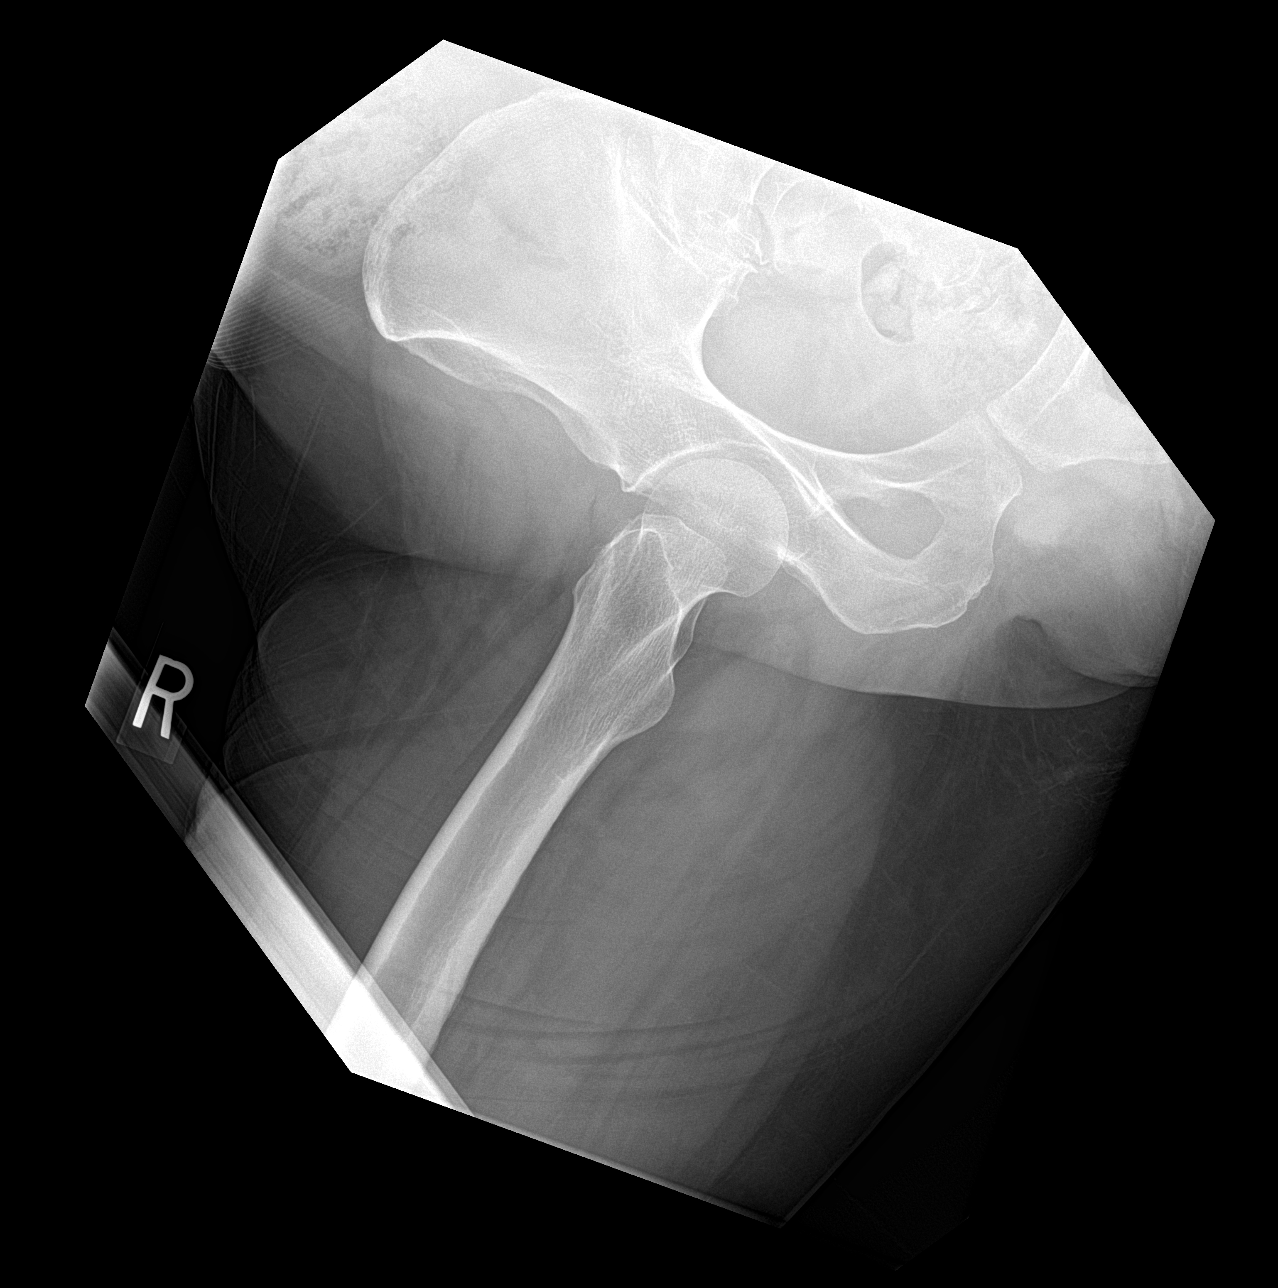

[3 of 3 positions shown; findings below may reference images not displayed]

FINDINGS: Femoral heads are located. Sacroiliac joints are symmetric. No focal
osseous lesion.
IMPRESSION: No acute osseous abnormality.
